# Patient Record
Sex: Female | Born: 1935
Health system: Southern US, Community
[De-identification: ages and names within clinical notes are randomized; demographics above are authoritative.]

## PROBLEM LIST (undated history)

## (undated) DIAGNOSIS — I10 Essential (primary) hypertension: Secondary | ICD-10-CM

## (undated) DIAGNOSIS — Z5189 Encounter for other specified aftercare: Secondary | ICD-10-CM

## (undated) HISTORY — PX: CATARACT EXTRACTION: SUR2

## (undated) HISTORY — DX: Essential (primary) hypertension: I10

## (undated) HISTORY — PX: GLAUCOMA SURGERY: SHX656

## (undated) HISTORY — DX: Encounter for other specified aftercare: Z51.89

---

## 1975-02-11 DIAGNOSIS — Z5189 Encounter for other specified aftercare: Secondary | ICD-10-CM

## 1975-02-11 DIAGNOSIS — IMO0001 Reserved for inherently not codable concepts without codable children: Secondary | ICD-10-CM

## 1975-02-11 HISTORY — DX: Encounter for other specified aftercare: Z51.89

## 1975-02-11 HISTORY — DX: Reserved for inherently not codable concepts without codable children: IMO0001

## 1975-02-11 HISTORY — PX: ABDOMINAL HYSTERECTOMY: SHX81

## 2011-03-07 ENCOUNTER — Encounter: Payer: Self-pay | Admitting: Internal Medicine

## 2011-03-07 ENCOUNTER — Ambulatory Visit (INDEPENDENT_AMBULATORY_CARE_PROVIDER_SITE_OTHER): Payer: Medicare Other | Admitting: Internal Medicine

## 2011-03-07 ENCOUNTER — Ambulatory Visit: Payer: Medicare Other

## 2011-03-07 VITALS — BP 120/82 | HR 63 | Temp 97.8°F | Ht 62.0 in | Wt 166.0 lb

## 2011-03-07 DIAGNOSIS — Z1211 Encounter for screening for malignant neoplasm of colon: Secondary | ICD-10-CM

## 2011-03-07 DIAGNOSIS — Z136 Encounter for screening for cardiovascular disorders: Secondary | ICD-10-CM

## 2011-03-07 DIAGNOSIS — R7309 Other abnormal glucose: Secondary | ICD-10-CM

## 2011-03-07 DIAGNOSIS — Z23 Encounter for immunization: Secondary | ICD-10-CM

## 2011-03-07 DIAGNOSIS — Z1231 Encounter for screening mammogram for malignant neoplasm of breast: Secondary | ICD-10-CM | POA: Diagnosis not present

## 2011-03-07 DIAGNOSIS — Z0000 Encounter for general adult medical examination without abnormal findings: Secondary | ICD-10-CM

## 2011-03-07 DIAGNOSIS — R079 Chest pain, unspecified: Secondary | ICD-10-CM

## 2011-03-07 DIAGNOSIS — R5383 Other fatigue: Secondary | ICD-10-CM

## 2011-03-07 DIAGNOSIS — R739 Hyperglycemia, unspecified: Secondary | ICD-10-CM

## 2011-03-07 DIAGNOSIS — R5381 Other malaise: Secondary | ICD-10-CM

## 2011-03-07 DIAGNOSIS — H409 Unspecified glaucoma: Secondary | ICD-10-CM | POA: Insufficient documentation

## 2011-03-07 LAB — HEPATIC FUNCTION PANEL
ALT: 13 U/L (ref 0–35)
Albumin: 4.2 g/dL (ref 3.5–5.2)
Total Bilirubin: 1 mg/dL (ref 0.3–1.2)

## 2011-03-07 LAB — BASIC METABOLIC PANEL
BUN: 16 mg/dL (ref 6–23)
Creatinine, Ser: 1 mg/dL (ref 0.4–1.2)
Glucose, Bld: 116 mg/dL — ABNORMAL HIGH (ref 70–99)
Potassium: 5 mEq/L (ref 3.5–5.1)
Sodium: 141 mEq/L (ref 135–145)

## 2011-03-07 LAB — LIPID PANEL
Cholesterol: 166 mg/dL (ref 0–200)
LDL Cholesterol: 91 mg/dL (ref 0–99)

## 2011-03-07 LAB — CBC WITH DIFFERENTIAL/PLATELET
Basophils Absolute: 0 10*3/uL (ref 0.0–0.1)
Eosinophils Absolute: 0.1 10*3/uL (ref 0.0–0.7)
MCHC: 33.9 g/dL (ref 30.0–36.0)
MCV: 91.8 fl (ref 78.0–100.0)
Monocytes Absolute: 0.4 10*3/uL (ref 0.1–1.0)
Neutrophils Relative %: 60.2 % (ref 43.0–77.0)
Platelets: 273 10*3/uL (ref 150.0–400.0)
RDW: 13.1 % (ref 11.5–14.6)

## 2011-03-07 LAB — HEMOGLOBIN A1C: Hgb A1c MFr Bld: 6.1 % (ref 4.6–6.5)

## 2011-03-07 NOTE — Patient Instructions (Signed)
It was good to see you today. Test(s) ordered today. Your results will be called to you after review (48-72hours after test completion). If any changes need to be made, you will be notified at that time. We have reviewed your prior records including labs and tests today Medications reviewed, no changes at this time Health Maintenance reviewed - mammogram ordered, patient to schedule appointment, tetanus booster given, colonoscopy refer done. we'll make referral to GI for colonoscopy and mammogram . Our office will contact you regarding appointment(s) once made.  Please schedule followup in 1 year, call sooner if problems.

## 2011-03-08 LAB — HEMOGLOBIN A1C
Hgb A1c MFr Bld: 6.9 % — ABNORMAL HIGH (ref <5.7)
Mean Plasma Glucose: 151 mg/dL — ABNORMAL HIGH (ref <117)

## 2011-03-09 ENCOUNTER — Encounter: Payer: Self-pay | Admitting: Internal Medicine

## 2011-03-09 DIAGNOSIS — R739 Hyperglycemia, unspecified: Secondary | ICD-10-CM | POA: Insufficient documentation

## 2011-03-09 NOTE — Progress Notes (Signed)
Subjective:    Patient ID: Melissa Warren, female    DOB: April 07, 1933, 78 y.o.   MRN: [id number]  HPI  New pt to me and our practice, here to establish care Has not been to PCP in >15y -  sees [facility]n URI and optho [facility]) q6-38mo for glaucoma Concern for DM given strong FH same and ?chol  transient L breast tissue pain x 3 days 6 weeks ago - was tender to touch, spont resolution, no recurrence since - No bruising, no breast mass - no recalled trauma or injury   Also here for medicare wellness  Diet: heart healthy Physical activity: sedentary Depression/mood screen: negative Hearing: intact to whispered voice Visual acuity: grossly normal, performs annual eye exam  ADLs: capable Fall risk: none Home safety: good Cognitive evaluation: intact to orientation, naming, recall and repetition EOL planning: adv directives, full code/ I agree  I have personally reviewed and have noted 1. The patient's medical and social history 2. Their use of alcohol, tobacco or illicit drugs 3. Their current medications and supplements 4. The patient's functional ability including ADL's, fall risks, home safety risks and hearing or visual impairment. 5. Diet and physical activities 6. Evidence for depression or mood disorders  Past Medical History  Diagnosis Date  . Glaucoma    Melissa Warren does not currently have medications on file.  Family History  Problem Relation Age of Onset  . Colon cancer Sister 10  . Prostate cancer Paternal Grandfather   . Stroke Maternal Grandmother   . Hypertension Maternal Grandmother   . Diabetes Mother   . Diabetes Maternal Grandmother   . Diabetes Brother   . Diabetes Brother   . Diabetes Brother   . Diabetes Sister   . Diabetes Sister   . Diabetes Sister    History   Social History  . Marital Status: Widowed    Spouse Name: N/A    Number of Children: N/A  . Years of Education: N/A   Occupational History  . Not on file.   Social History  Main Topics  . Smoking status: Former Games developer  . Smokeless tobacco: Not on file  . Alcohol Use: No  . Drug Use: No  . Sexually Active: Not on file   Other Topics Concern  . Not on file   Social History Narrative   [place] SW [place], lives with older sister and neice    Review of Systems Constitutional: Negative for fever or weight change; positive for fatigue.  Respiratory: Negative for cough and shortness of breath.   Cardiovascular: Negative for chest pain or palpitations.  Gastrointestinal: Negative for abdominal pain, no bowel changes.  Musculoskeletal: Negative for gait problem or joint swelling.  Skin: Negative for rash.  Neurological: Negative for dizziness or headache.  No other specific complaints in a complete review of systems (except as listed in HPI above).     Objective:   Physical Exam BP 120/82  Pulse 63  Temp(Src) 97.8 F (36.6 C) (Oral)  Ht 5\' 2"  (1.575 m)  Wt 166 lb (75.297 kg)  BMI 30.36 kg/m2  SpO2 97% Wt Readings from Last 3 Encounters:  03/07/11 166 lb (75.297 kg)   Constitutional: She appears well-developed and well-nourished. No distress.  HENT: Head: Normocephalic and atraumatic. Ears: B TMs ok, no erythema or effusion; Nose: Nose normal.  Mouth/Throat: Oropharynx is clear and moist. No oropharyngeal exudate.  Eyes: Conjunctivae and EOM are normal. Pupils are equal,  round, and reactive to light. No scleral icterus.  Neck: Normal range of motion. Neck supple. No JVD present. No thyromegaly present.  Cardiovascular: Normal rate, regular rhythm and normal heart sounds.  No murmur heard. No BLE edema. Pulmonary/Chest: Effort normal and breath sounds normal. No respiratory distress. She has no wheezes.  Abdominal: Soft. Bowel sounds are normal. She exhibits no distension. There is no tenderness. no masses Musculoskeletal: Normal range of motion, no joint effusions. No gross deformities Neurological: She is  alert and oriented to person, place, and time. No cranial nerve deficit. Coordination normal.  Skin: Skin is warm and dry. No rash noted. No erythema.  Psychiatric: She has a normal mood and affect. Her behavior is normal. Judgment and thought content normal.   Lab Results  Component Value Date   WBC 6.6 03/07/2011   HGB 14.1 03/07/2011   HCT 41.4 03/07/2011   PLT 273.0 03/07/2011   GLUCOSE 116* 03/07/2011   CHOL 166 03/07/2011   TRIG 79.0 03/07/2011   HDL 59.20 03/07/2011   LDLCALC 91 03/07/2011   ALT 13 03/07/2011   AST 21 03/07/2011   NA 141 03/07/2011   K 5.0 03/07/2011   CL 106 03/07/2011   CREATININE 1.0 03/07/2011   BUN 16 03/07/2011   CO2 28 03/07/2011   TSH 1.05 03/07/2011   HGBA1C 6.9* 03/07/2011   EKG today: NSR @ 60 bpm - no ischemic change or arrythmia     Assessment & Plan:   AWV - v70.0 - Today patient counseled on age appropriate routine health concerns for screening and prevention, each reviewed and up to date or declined. Immunizations reviewed and up to date or declined. Labs ordered/ECG reviewed. Risk factors for depression reviewed and negative. Hearing function and visual acuity are intact. ADLs screened and addressed as needed. Functional ability and level of safety reviewed and appropriate. Education, counseling and referrals performed based on assessed risks today. Patient provided with a copy of personalized plan for preventive services.  Fatigue - exam nonspecific - check labs as ordered, reassurance provided  Transient L breast pain, resolved - suspect MSkel but refer for mammo - EKG today ok - labs as ordered - pt to call if recurrence for other eval tx as needed, reassurance provided

## 2011-03-19 DIAGNOSIS — Z83511 Family history of glaucoma: Secondary | ICD-10-CM | POA: Diagnosis not present

## 2011-03-19 DIAGNOSIS — H409 Unspecified glaucoma: Secondary | ICD-10-CM | POA: Diagnosis not present

## 2011-03-19 DIAGNOSIS — H4011X0 Primary open-angle glaucoma, stage unspecified: Secondary | ICD-10-CM | POA: Diagnosis not present

## 2011-03-20 ENCOUNTER — Encounter: Payer: Self-pay | Admitting: Gastroenterology

## 2011-03-21 ENCOUNTER — Ambulatory Visit
Admission: RE | Admit: 2011-03-21 | Discharge: 2011-03-21 | Disposition: A | Discharge status: Home / Self Care | Payer: Medicare Other | Source: Ambulatory Visit | Attending: Internal Medicine | Admitting: Internal Medicine

## 2011-03-21 DIAGNOSIS — Z1231 Encounter for screening mammogram for malignant neoplasm of breast: Secondary | ICD-10-CM | POA: Diagnosis not present

## 2011-03-24 ENCOUNTER — Ambulatory Visit (AMBULATORY_SURGERY_CENTER): Payer: Medicare Other | Admitting: *Deleted

## 2011-03-24 VITALS — Ht 62.0 in | Wt 166.0 lb

## 2011-03-24 DIAGNOSIS — Z1211 Encounter for screening for malignant neoplasm of colon: Secondary | ICD-10-CM

## 2011-03-24 MED ORDER — PEG-KCL-NACL-NASULF-NA ASC-C 100 G PO SOLR: ORAL | Status: DC

## 2011-03-28 ENCOUNTER — Other Ambulatory Visit: Payer: Self-pay | Admitting: Internal Medicine

## 2011-03-28 DIAGNOSIS — R928 Other abnormal and inconclusive findings on diagnostic imaging of breast: Secondary | ICD-10-CM

## 2011-04-07 ENCOUNTER — Ambulatory Visit (AMBULATORY_SURGERY_CENTER): Payer: Medicare Other | Admitting: Gastroenterology

## 2011-04-07 ENCOUNTER — Encounter: Payer: Self-pay | Admitting: Gastroenterology

## 2011-04-07 VITALS — BP 182/99 | HR 64 | Temp 98.2°F | Resp 21 | Ht 62.0 in | Wt 166.0 lb

## 2011-04-07 DIAGNOSIS — Z1211 Encounter for screening for malignant neoplasm of colon: Secondary | ICD-10-CM | POA: Diagnosis not present

## 2011-04-07 DIAGNOSIS — R7309 Other abnormal glucose: Secondary | ICD-10-CM | POA: Diagnosis not present

## 2011-04-07 DIAGNOSIS — H409 Unspecified glaucoma: Secondary | ICD-10-CM | POA: Diagnosis not present

## 2011-04-07 DIAGNOSIS — Z800 Family history of malignant neoplasm of digestive organs: Secondary | ICD-10-CM | POA: Diagnosis not present

## 2011-04-07 MED ORDER — SODIUM CHLORIDE 0.9 % IV SOLN: 500.0000 mL | INTRAVENOUS | Status: DC

## 2011-04-07 NOTE — Progress Notes (Signed)
Patient did not experience any of the following events: a burn prior to discharge; a fall within the facility; wrong site/side/patient/procedure/implant event; or a hospital transfer or hospital admission upon discharge from the facility. (G8907) Patient did not have preoperative order for IV antibiotic SSI prophylaxis. (G8918)  

## 2011-04-07 NOTE — Op Note (Signed)
 [facility] [address] [place], [place]  [address]  COLONOSCOPY PROCEDURE REPORT  PATIENT:  Melissa, Warren  MR#:  [id number] BIRTHDATE:  [birth year], 78 yrs. old  GENDER:  female ENDOSCOPIST:  Vania Rea. Jarold Motto, MD, [facility] REF. BY:  Rene Paci, M.D. PROCEDURE DATE:  04/07/2011 PROCEDURE:  Higher-risk screening colonoscopy G0105  ASA CLASS:  Class II INDICATIONS:  family history of colon cancer sister MEDICATIONS:   propofol (Diprivan) 100 mg IV  DESCRIPTION OF PROCEDURE:   After the risks and benefits and of the procedure were explained, informed consent was obtained. Digital rectal exam was performed and revealed no abnormalities. The LB [id number] endoscope was introduced through the anus and advanced to the cecum, which was identified by both the appendix and ileocecal valve.  The quality of the prep was excellent, using MoviPrep.  The instrument was then slowly withdrawn as the colon was fully examined. <<PROCEDUREIMAGES>>  FINDINGS:  Moderate diverticulosis was found in the sigmoid to descending colon segments. SCATTERED RIGHT COLON TICS ALSO NOTED. No polyps or cancers were seen.  This was otherwise a normal examination of the colon.   Retroflexed views in the rectum revealed no abnormalities.    The scope was then withdrawn from the patient and the procedure completed.  COMPLICATIONS:  None ENDOSCOPIC IMPRESSION: 1) Moderate diverticulosis in the sigmoid to descending colon segments 2) No polyps or cancers 3) Otherwise normal examination RECOMMENDATIONS: 1) High fiber diet. F/U COLONOSCOPY PRN AS NEEDED./.  REPEAT EXAM:  No  ______________________________ Vania Rea. Jarold Motto, MD, [name]  CC:  n. eSIGNED:   Vania Rea. Patterson at 04/07/2011 01:56 PM  Agustin Cree, [phone]

## 2011-04-07 NOTE — Patient Instructions (Signed)
YOU HAD AN ENDOSCOPIC PROCEDURE TODAY AT THE [facility]: Refer to the procedure report that was given to you for any specific questions about what was found during the examination.  If the procedure report does not answer your questions, please call your gastroenterologist to clarify.  If you requested that your care partner not be given the details of your procedure findings, then the procedure report has been included in a sealed envelope for you to review at your convenience later.  YOU SHOULD EXPECT: Some feelings of bloating in the abdomen. Passage of more gas than usual.  Walking can help get rid of the air that was put into your GI tract during the procedure and reduce the bloating. If you had a lower endoscopy (such as a colonoscopy or flexible sigmoidoscopy) you may notice spotting of blood in your stool or on the toilet paper. If you underwent a bowel prep for your procedure, then you may not have a normal bowel movement for a few days.  DIET: Your first meal following the procedure should be a light meal and then it is ok to progress to your normal diet.  A half-sandwich or bowl of soup is an example of a good first meal.  Heavy or fried foods are harder to digest and may make you feel nauseous or bloated.  Likewise meals heavy in dairy and vegetables can cause extra gas to form and this can also increase the bloating.  Drink plenty of fluids but you should avoid alcoholic beverages for 24 hours.  ACTIVITY: Your care partner should take you home directly after the procedure.  You should plan to take it easy, moving slowly for the rest of the day.  You can resume normal activity the day after the procedure however you should NOT DRIVE or use heavy machinery for 24 hours (because of the sedation medicines used during the test).    SYMPTOMS TO REPORT IMMEDIATELY: A gastroenterologist can be reached at any hour.  During normal business hours, 8:30 AM to 5:00 PM Monday through Friday,  call [phone].  After hours and on weekends, please call the GI answering service at [phone] who will take a message and have the physician on call contact you.   Following lower endoscopy (colonoscopy or flexible sigmoidoscopy):  Excessive amounts of blood in the stool  Significant tenderness or worsening of abdominal pains  Swelling of the abdomen that is new, acute  Fever of 100F or higher  Following upper endoscopy (EGD)  Vomiting of blood or coffee ground material  New chest pain or pain under the shoulder blades  Painful or persistently difficult swallowing  New shortness of breath  Fever of 100F or higher  Black, tarry-looking stools  FOLLOW UP: If any biopsies were taken you will be contacted by phone or by letter within the next 1-3 weeks.  Call your gastroenterologist if you have not heard about the biopsies in 3 weeks.  Our staff will call the home number listed on your records the next business day following your procedure to check on you and address any questions or concerns that you may have at that time regarding the information given to you following your procedure. This is a courtesy call and so if there is no answer at the home number and we have not heard from you through the emergency physician on call, we will assume that you have returned to your regular daily activities without incident.  SIGNATURES/CONFIDENTIALITY: You and/or your care   partner have signed paperwork which will be entered into your electronic medical record.  These signatures attest to the fact that that the information above on your After Visit Summary has been reviewed and is understood.  Full responsibility of the confidentiality of this discharge information lies with you and/or your care-partner.  

## 2011-04-08 ENCOUNTER — Telehealth: Payer: Self-pay

## 2011-04-08 NOTE — Telephone Encounter (Signed)
  Follow up Call-  Call back number 04/07/2011  Post procedure Call Back phone  # [phone]  Permission to leave phone message Yes     Patient questions:  Do you have a fever, pain , or abdominal swelling? no Pain Score  0 *  Have you tolerated food without any problems? yes  Have you been able to return to your normal activities? yes  Do you have any questions about your discharge instructions: Diet   no Medications  no Follow up visit  no  Do you have questions or concerns about your Care? no  Actions: * If pain score is 4 or above: No action needed, pain <4.  Per the pt "everything looks good". Maw

## 2011-04-10 ENCOUNTER — Ambulatory Visit
Admission: RE | Admit: 2011-04-10 | Discharge: 2011-04-10 | Disposition: A | Discharge status: Home / Self Care | Payer: Medicare Other | Source: Ambulatory Visit | Attending: Internal Medicine | Admitting: Internal Medicine

## 2011-04-10 ENCOUNTER — Other Ambulatory Visit: Payer: Self-pay | Admitting: Internal Medicine

## 2011-04-10 DIAGNOSIS — R928 Other abnormal and inconclusive findings on diagnostic imaging of breast: Secondary | ICD-10-CM

## 2011-04-10 DIAGNOSIS — N63 Unspecified lump in unspecified breast: Secondary | ICD-10-CM | POA: Diagnosis not present

## 2011-04-10 DIAGNOSIS — N6019 Diffuse cystic mastopathy of unspecified breast: Secondary | ICD-10-CM | POA: Diagnosis not present

## 2011-04-16 ENCOUNTER — Other Ambulatory Visit: Payer: Self-pay | Admitting: Internal Medicine

## 2011-04-16 ENCOUNTER — Ambulatory Visit
Admission: RE | Admit: 2011-04-16 | Discharge: 2011-04-16 | Disposition: A | Discharge status: Home / Self Care | Payer: Medicare Other | Source: Ambulatory Visit | Attending: Internal Medicine | Admitting: Internal Medicine

## 2011-04-16 DIAGNOSIS — N6089 Other benign mammary dysplasias of unspecified breast: Secondary | ICD-10-CM | POA: Diagnosis not present

## 2011-04-16 DIAGNOSIS — R928 Other abnormal and inconclusive findings on diagnostic imaging of breast: Secondary | ICD-10-CM

## 2011-04-16 DIAGNOSIS — N6489 Other specified disorders of breast: Secondary | ICD-10-CM | POA: Diagnosis not present

## 2011-04-16 DIAGNOSIS — N63 Unspecified lump in unspecified breast: Secondary | ICD-10-CM | POA: Diagnosis not present

## 2011-04-25 DIAGNOSIS — Z83511 Family history of glaucoma: Secondary | ICD-10-CM | POA: Diagnosis not present

## 2011-04-25 DIAGNOSIS — H409 Unspecified glaucoma: Secondary | ICD-10-CM | POA: Diagnosis not present

## 2011-04-25 DIAGNOSIS — H4011X0 Primary open-angle glaucoma, stage unspecified: Secondary | ICD-10-CM | POA: Diagnosis not present

## 2011-06-10 DIAGNOSIS — Z83511 Family history of glaucoma: Secondary | ICD-10-CM | POA: Diagnosis not present

## 2011-06-10 DIAGNOSIS — H409 Unspecified glaucoma: Secondary | ICD-10-CM | POA: Diagnosis not present

## 2011-06-10 DIAGNOSIS — H4011X0 Primary open-angle glaucoma, stage unspecified: Secondary | ICD-10-CM | POA: Diagnosis not present

## 2011-06-16 DIAGNOSIS — H2510 Age-related nuclear cataract, unspecified eye: Secondary | ICD-10-CM | POA: Diagnosis not present

## 2011-06-16 DIAGNOSIS — H409 Unspecified glaucoma: Secondary | ICD-10-CM | POA: Diagnosis not present

## 2011-06-16 DIAGNOSIS — Z83511 Family history of glaucoma: Secondary | ICD-10-CM | POA: Diagnosis not present

## 2011-06-16 DIAGNOSIS — H4011X0 Primary open-angle glaucoma, stage unspecified: Secondary | ICD-10-CM | POA: Diagnosis not present

## 2011-06-17 DIAGNOSIS — H409 Unspecified glaucoma: Secondary | ICD-10-CM | POA: Diagnosis not present

## 2011-06-17 DIAGNOSIS — H4011X0 Primary open-angle glaucoma, stage unspecified: Secondary | ICD-10-CM | POA: Diagnosis not present

## 2011-06-17 DIAGNOSIS — H4010X0 Unspecified open-angle glaucoma, stage unspecified: Secondary | ICD-10-CM | POA: Diagnosis not present

## 2011-06-25 DIAGNOSIS — Z9889 Other specified postprocedural states: Secondary | ICD-10-CM | POA: Diagnosis not present

## 2011-07-02 DIAGNOSIS — H4011X0 Primary open-angle glaucoma, stage unspecified: Secondary | ICD-10-CM | POA: Diagnosis not present

## 2011-07-02 DIAGNOSIS — Z9889 Other specified postprocedural states: Secondary | ICD-10-CM | POA: Diagnosis not present

## 2011-07-08 DIAGNOSIS — H04129 Dry eye syndrome of unspecified lacrimal gland: Secondary | ICD-10-CM | POA: Diagnosis not present

## 2011-07-14 DIAGNOSIS — Z9889 Other specified postprocedural states: Secondary | ICD-10-CM | POA: Diagnosis not present

## 2011-08-13 DIAGNOSIS — H409 Unspecified glaucoma: Secondary | ICD-10-CM | POA: Diagnosis not present

## 2011-08-13 DIAGNOSIS — H4011X0 Primary open-angle glaucoma, stage unspecified: Secondary | ICD-10-CM | POA: Diagnosis not present

## 2011-09-24 DIAGNOSIS — H409 Unspecified glaucoma: Secondary | ICD-10-CM | POA: Diagnosis not present

## 2011-09-24 DIAGNOSIS — H2510 Age-related nuclear cataract, unspecified eye: Secondary | ICD-10-CM | POA: Diagnosis not present

## 2011-09-24 DIAGNOSIS — H4011X0 Primary open-angle glaucoma, stage unspecified: Secondary | ICD-10-CM | POA: Diagnosis not present

## 2011-10-24 DIAGNOSIS — H4011X0 Primary open-angle glaucoma, stage unspecified: Secondary | ICD-10-CM | POA: Diagnosis not present

## 2011-12-03 DIAGNOSIS — H4011X0 Primary open-angle glaucoma, stage unspecified: Secondary | ICD-10-CM | POA: Diagnosis not present

## 2011-12-03 DIAGNOSIS — H2510 Age-related nuclear cataract, unspecified eye: Secondary | ICD-10-CM | POA: Diagnosis not present

## 2011-12-03 DIAGNOSIS — H409 Unspecified glaucoma: Secondary | ICD-10-CM | POA: Diagnosis not present

## 2011-12-10 DIAGNOSIS — H4011X0 Primary open-angle glaucoma, stage unspecified: Secondary | ICD-10-CM | POA: Diagnosis not present

## 2011-12-10 DIAGNOSIS — H409 Unspecified glaucoma: Secondary | ICD-10-CM | POA: Diagnosis not present

## 2011-12-12 HISTORY — PX: GLAUCOMA VALVE INSERTION: SHX5297

## 2011-12-18 DIAGNOSIS — H4010X0 Unspecified open-angle glaucoma, stage unspecified: Secondary | ICD-10-CM | POA: Diagnosis not present

## 2011-12-18 DIAGNOSIS — H4011X0 Primary open-angle glaucoma, stage unspecified: Secondary | ICD-10-CM | POA: Diagnosis not present

## 2011-12-18 DIAGNOSIS — H409 Unspecified glaucoma: Secondary | ICD-10-CM | POA: Diagnosis not present

## 2012-02-06 DIAGNOSIS — Z9889 Other specified postprocedural states: Secondary | ICD-10-CM | POA: Diagnosis not present

## 2012-03-04 ENCOUNTER — Other Ambulatory Visit: Payer: Self-pay | Admitting: Internal Medicine

## 2012-03-04 DIAGNOSIS — N63 Unspecified lump in unspecified breast: Secondary | ICD-10-CM

## 2012-03-15 ENCOUNTER — Ambulatory Visit
Admission: RE | Admit: 2012-03-15 | Discharge: 2012-03-15 | Disposition: A | Discharge status: Home / Self Care | Payer: Medicare Other | Source: Ambulatory Visit | Attending: Internal Medicine | Admitting: Internal Medicine

## 2012-03-15 DIAGNOSIS — N63 Unspecified lump in unspecified breast: Secondary | ICD-10-CM

## 2012-03-15 DIAGNOSIS — R922 Inconclusive mammogram: Secondary | ICD-10-CM | POA: Diagnosis not present

## 2012-03-22 ENCOUNTER — Encounter: Payer: Self-pay | Admitting: Internal Medicine

## 2012-03-22 ENCOUNTER — Ambulatory Visit (INDEPENDENT_AMBULATORY_CARE_PROVIDER_SITE_OTHER): Payer: Medicare Other | Admitting: Internal Medicine

## 2012-03-22 ENCOUNTER — Other Ambulatory Visit (INDEPENDENT_AMBULATORY_CARE_PROVIDER_SITE_OTHER): Payer: Medicare Other

## 2012-03-22 VITALS — BP 142/90 | HR 70 | Temp 97.7°F | Ht 62.0 in | Wt 158.1 lb

## 2012-03-22 DIAGNOSIS — R7309 Other abnormal glucose: Secondary | ICD-10-CM

## 2012-03-22 DIAGNOSIS — Z23 Encounter for immunization: Secondary | ICD-10-CM

## 2012-03-22 DIAGNOSIS — Z136 Encounter for screening for cardiovascular disorders: Secondary | ICD-10-CM

## 2012-03-22 DIAGNOSIS — R5381 Other malaise: Secondary | ICD-10-CM | POA: Diagnosis not present

## 2012-03-22 DIAGNOSIS — R5383 Other fatigue: Secondary | ICD-10-CM

## 2012-03-22 DIAGNOSIS — H409 Unspecified glaucoma: Secondary | ICD-10-CM | POA: Diagnosis not present

## 2012-03-22 DIAGNOSIS — Z0000 Encounter for general adult medical examination without abnormal findings: Secondary | ICD-10-CM

## 2012-03-22 DIAGNOSIS — R739 Hyperglycemia, unspecified: Secondary | ICD-10-CM

## 2012-03-22 LAB — CBC WITH DIFFERENTIAL/PLATELET
Basophils Absolute: 0 10*3/uL (ref 0.0–0.1)
Lymphocytes Relative: 32.6 % (ref 12.0–46.0)
Monocytes Relative: 7.3 % (ref 3.0–12.0)
Neutrophils Relative %: 58 % (ref 43.0–77.0)
Platelets: 280 10*3/uL (ref 150.0–400.0)
RDW: 12.8 % (ref 11.5–14.6)

## 2012-03-22 LAB — HEPATIC FUNCTION PANEL
ALT: 11 U/L (ref 0–35)
AST: 19 U/L (ref 0–37)
Bilirubin, Direct: 0.1 mg/dL (ref 0.0–0.3)
Total Bilirubin: 0.7 mg/dL (ref 0.3–1.2)

## 2012-03-22 LAB — BASIC METABOLIC PANEL
BUN: 15 mg/dL (ref 6–23)
Calcium: 9.4 mg/dL (ref 8.4–10.5)
GFR: 75.19 mL/min (ref >60.00)
Glucose, Bld: 95 mg/dL (ref 70–99)
Sodium: 138 mEq/L (ref 135–145)

## 2012-03-22 LAB — TSH: TSH: 0.67 u[IU]/mL (ref 0.35–5.50)

## 2012-03-22 LAB — HEMOGLOBIN A1C: Hgb A1c MFr Bld: 5.8 % (ref 4.6–6.5)

## 2012-03-22 LAB — LIPID PANEL: HDL: 49.9 mg/dL (ref >39.00)

## 2012-03-22 NOTE — Assessment & Plan Note (Signed)
Interval hx reviewed - continue drops and follow up as planned

## 2012-03-22 NOTE — Progress Notes (Signed)
Subjective:    Patient ID: Melissa Warren, female    DOB: 1933-03-05, 79 y.o.   MRN: [id number]  HPI  here for medicare wellness  Diet: heart healthy Physical activity: sedentary Depression/mood screen: negative Hearing: intact to whispered voice Visual acuity: grossly normal, performs eye exam with optho [facility]) q6-13mo for glaucoma ADLs: capable Fall risk: none Home safety: good Cognitive evaluation: intact to orientation, naming, recall and repetition EOL planning: adv directives, full code/ I agree  I have personally reviewed and have noted 1. The patient's medical and social history 2. Their use of alcohol, tobacco or illicit drugs 3. Their current medications and supplements 4. The patient's functional ability including ADL's, fall risks, home safety risks and hearing or visual impairment. 5. Diet and physical activities 6. Evidence for depression or mood disorders  Past Medical History  Diagnosis Date  . Glaucoma   . Blood transfusion 1977    after hysterectomy  . Cataract   . Constipation     Family History  Problem Relation Age of Onset  . Colon cancer Sister 56  . Prostate cancer Paternal Grandfather   . Stroke Maternal Grandmother   . Hypertension Maternal Grandmother   . Diabetes Maternal Grandmother   . Diabetes Mother   . Diabetes Brother   . Diabetes Brother   . Diabetes Brother   . Diabetes Sister   . Diabetes Sister   . Diabetes Sister    History  Substance Use Topics  . Smoking status: Former Games developer  . Smokeless tobacco: Never Used  . Alcohol Use: No   Review of Systems  Constitutional: Negative for fever or weight change; positive for fatigue.  Respiratory: Negative for cough and shortness of breath.   Cardiovascular: Negative for chest pain or palpitations.  Gastrointestinal: Negative for abdominal pain, no bowel changes.  Musculoskeletal: Negative for gait problem or joint swelling.  Skin: Negative for rash.  Neurological:  Negative for dizziness or headache.  No other specific complaints in a complete review of systems (except as listed in HPI above).     Objective:   Physical Exam  BP 142/90  Pulse 70  Temp(Src) 97.7 F (36.5 C) (Oral)  Ht 5\' 2"  (1.575 m)  Wt 158 lb 1.9 oz (71.723 kg)  BMI 28.91 kg/m2  SpO2 97% Wt Readings from Last 3 Encounters:  03/22/12 158 lb 1.9 oz (71.723 kg)  04/07/11 166 lb (75.297 kg)  03/24/11 166 lb (75.297 kg)   Constitutional: She appears well-developed and well-nourished. No distress.  HENT: Head: Normocephalic and atraumatic. Ears: B TMs ok, no erythema or effusion; Nose: Nose normal.  Mouth/Throat: Oropharynx is clear and moist. No oropharyngeal exudate.  Eyes: R eye with min edema/swelling compared to L - Conjunctivae and EOM are normal. Pupils are equal, round, and reactive to light. No scleral icterus.  Neck: Normal range of motion. Neck supple. No JVD present. No thyromegaly present.  Cardiovascular: Normal rate, regular rhythm and normal heart sounds.  No murmur heard. No BLE edema. Pulmonary/Chest: Effort normal and breath sounds normal. No respiratory distress. She has no wheezes.  Abdominal: Soft. Bowel sounds are normal. She exhibits no distension. There is no tenderness. no masses Musculoskeletal: Normal range of motion, no joint effusions. No gross deformities Neurological: She is alert and oriented to person, place, and time. No cranial nerve deficit. Coordination normal.  Skin: Skin is warm and dry. No rash noted. No erythema.  Psychiatric: She has a normal mood and affect. Her behavior  is normal. Judgment and thought content normal.   Lab Results  Component Value Date   WBC 6.6 03/07/2011   HGB 14.1 03/07/2011   HCT 41.4 03/07/2011   PLT 273.0 03/07/2011   GLUCOSE 116* 03/07/2011   CHOL 166 03/07/2011   TRIG 79.0 03/07/2011   HDL 59.20 03/07/2011   LDLCALC 91 03/07/2011   ALT 13 03/07/2011   AST 21 03/07/2011   NA 141 03/07/2011   K 5.0 03/07/2011   CL  106 03/07/2011   CREATININE 1.0 03/07/2011   BUN 16 03/07/2011   CO2 28 03/07/2011   TSH 1.05 03/07/2011   HGBA1C 6.9* 03/07/2011   EKG today: NSR @ 63 bpm - no ischemic change or arrythmia - unchanged from 02/2011     Assessment & Plan:   AWV - v70.0 - Today patient counseled on age appropriate routine health concerns for screening and prevention, each reviewed and up to date or declined. Immunizations reviewed and up to date or declined. Labs ordered/ECG reviewed. Risk factors for depression reviewed and negative. Hearing function and visual acuity are intact. ADLs screened and addressed as needed. Functional ability and level of safety reviewed and appropriate. Education, counseling and referrals performed based on assessed risks today. Patient provided with a copy of personalized plan for preventive services.  Fatigue - nonspecific symptoms/exam - check screening labs

## 2012-03-22 NOTE — Assessment & Plan Note (Signed)
Strong FH same Check a1c Lab Results  Component Value Date   HGBA1C 6.9* 03/07/2011

## 2012-03-22 NOTE — Patient Instructions (Addendum)
It was good to see you today. Test(s) ordered today. Your results will be called to you after review (48-72hours after test completion). If any changes need to be made, you will be notified at that time. Medications reviewed and updated, no changes at this time Health Maintenance reviewed - pneumonia vaccine updated, Shingles vaccine - all other recommended immunizations and age-appropriate screenings are up-to-date.  Please schedule followup in 1 year, call sooner if problems.  Health Maintenance, Females A healthy lifestyle and preventative care can promote health and wellness.  Maintain regular health, dental, and eye exams.  Eat a healthy diet. Foods like vegetables, fruits, whole grains, low-fat dairy products, and lean protein foods contain the nutrients you need without too many calories. Decrease your intake of foods high in solid fats, added sugars, and salt. Get information about a proper diet from your caregiver, if necessary.  Regular physical exercise is one of the most important things you can do for your health. Most adults should get at least 150 minutes of moderate-intensity exercise (any activity that increases your heart rate and causes you to sweat) each week. In addition, most adults need muscle-strengthening exercises on 2 or more days a week.   Maintain a healthy weight. The body mass index (BMI) is a screening tool to identify possible weight problems. It provides an estimate of body fat based on height and weight. Your caregiver can help determine your BMI, and can help you achieve or maintain a healthy weight. For adults 20 years and older:  A BMI below 18.5 is considered underweight.  A BMI of 18.5 to 24.9 is normal.  A BMI of 25 to 29.9 is considered overweight.  A BMI of 30 and above is considered obese.  Maintain normal blood lipids and cholesterol by exercising and minimizing your intake of saturated fat. Eat a balanced diet with plenty of fruits and  vegetables. Blood tests for lipids and cholesterol should begin at age 69 and be repeated every 5 years. If your lipid or cholesterol levels are high, you are over 50, or you are a high risk for heart disease, you may need your cholesterol levels checked more frequently.Ongoing high lipid and cholesterol levels should be treated with medicines if diet and exercise are not effective.  If you smoke, find out from your caregiver how to quit. If you do not use tobacco, do not start.  If you are pregnant, do not drink alcohol. If you are breastfeeding, be very cautious about drinking alcohol. If you are not pregnant and choose to drink alcohol, do not exceed 1 drink per day. One drink is considered to be 12 ounces (355 mL) of beer, 5 ounces (148 mL) of wine, or 1.5 ounces (44 mL) of liquor.  Avoid use of street drugs. Do not share needles with anyone. Ask for help if you need support or instructions about stopping the use of drugs.  High blood pressure causes heart disease and increases the risk of stroke. Blood pressure should be checked at least every 1 to 2 years. Ongoing high blood pressure should be treated with medicines, if weight loss and exercise are not effective.  If you are 45 to 77 years old, ask your caregiver if you should take aspirin to prevent strokes.  Diabetes screening involves taking a blood sample to check your fasting blood sugar level. This should be done once every 3 years, after age 46, if you are within normal weight and without risk factors for diabetes. Testing should  be considered at a younger age or be carried out more frequently if you are overweight and have at least 1 risk factor for diabetes.  Breast cancer screening is essential preventative care for women. You should practice "breast self-awareness." This means understanding the normal appearance and feel of your breasts and may include breast self-examination. Any changes detected, no matter how small, should be  reported to a caregiver. Women in their 57s and 30s should have a clinical breast exam (CBE) by a caregiver as part of a regular health exam every 1 to 3 years. After age 33, women should have a CBE every year. Starting at age 68, women should consider having a mammogram (breast X-ray) every year. Women who have a family history of breast cancer should talk to their caregiver about genetic screening. Women at a high risk of breast cancer should talk to their caregiver about having an MRI and a mammogram every year.  The Pap test is a screening test for cervical cancer. Women should have a Pap test starting at age 46. Between ages 38 and 46, Pap tests should be repeated every 2 years. Beginning at age 18, you should have a Pap test every 3 years as long as the past 3 Pap tests have been normal. If you had a hysterectomy for a problem that was not cancer or a condition that could lead to cancer, then you no longer need Pap tests. If you are between ages 28 and 54, and you have had normal Pap tests going back 10 years, you no longer need Pap tests. If you have had past treatment for cervical cancer or a condition that could lead to cancer, you need Pap tests and screening for cancer for at least 20 years after your treatment. If Pap tests have been discontinued, risk factors (such as a new sexual partner) need to be reassessed to determine if screening should be resumed. Some women have medical problems that increase the chance of getting cervical cancer. In these cases, your caregiver may recommend more frequent screening and Pap tests.  The human papillomavirus (HPV) test is an additional test that may be used for cervical cancer screening. The HPV test looks for the virus that can cause the cell changes on the cervix. The cells collected during the Pap test can be tested for HPV. The HPV test could be used to screen women aged 54 years and older, and should be used in women of any age who have unclear Pap test  results. After the age of 35, women should have HPV testing at the same frequency as a Pap test.  Colorectal cancer can be detected and often prevented. Most routine colorectal cancer screening begins at the age of 15 and continues through age 41. However, your caregiver may recommend screening at an earlier age if you have risk factors for colon cancer. On a yearly basis, your caregiver may provide home test kits to check for hidden blood in the stool. Use of a small camera at the end of a tube, to directly examine the colon (sigmoidoscopy or colonoscopy), can detect the earliest forms of colorectal cancer. Talk to your caregiver about this at age 72, when routine screening begins. Direct examination of the colon should be repeated every 5 to 10 years through age 3, unless early forms of pre-cancerous polyps or small growths are found.  Hepatitis C blood testing is recommended for all people born from [birth year] through 1963 and any individual with known risks for  hepatitis C.  Practice safe sex. Use condoms and avoid high-risk sexual practices to reduce the spread of sexually transmitted infections (STIs). Sexually active women aged 21 and younger should be checked for Chlamydia, which is a common sexually transmitted infection. Older women with new or multiple partners should also be tested for Chlamydia. Testing for other STIs is recommended if you are sexually active and at increased risk.  Osteoporosis is a disease in which the bones lose minerals and strength with aging. This can result in serious bone fractures. The risk of osteoporosis can be identified using a bone density scan. Women ages 60 and over and women at risk for fractures or osteoporosis should discuss screening with their caregivers. Ask your caregiver whether you should be taking a calcium supplement or vitamin D to reduce the rate of osteoporosis.  Menopause can be associated with physical symptoms and risks. Hormone replacement therapy  is available to decrease symptoms and risks. You should talk to your caregiver about whether hormone replacement therapy is right for you.  Use sunscreen with a sun protection factor (SPF) of 30 or greater. Apply sunscreen liberally and repeatedly throughout the day. You should seek shade when your shadow is shorter than you. Protect yourself by wearing long sleeves, pants, a wide-brimmed hat, and sunglasses year round, whenever you are outdoors.  Notify your caregiver of new moles or changes in moles, especially if there is a change in shape or color. Also notify your caregiver if a mole is larger than the size of a pencil eraser.  Stay current with your immunizations. Document Released: 08/12/2010 Document Revised: 04/21/2011 Document Reviewed: 08/12/2010 [facility] Patient Information 2013 [place], [place].

## 2012-03-23 DIAGNOSIS — Z9889 Other specified postprocedural states: Secondary | ICD-10-CM | POA: Diagnosis not present

## 2012-03-29 DIAGNOSIS — H4440 Unspecified hypotony of eye: Secondary | ICD-10-CM | POA: Diagnosis not present

## 2012-05-13 DIAGNOSIS — Z9889 Other specified postprocedural states: Secondary | ICD-10-CM | POA: Diagnosis not present

## 2012-05-20 DIAGNOSIS — H10019 Acute follicular conjunctivitis, unspecified eye: Secondary | ICD-10-CM | POA: Diagnosis not present

## 2012-05-20 DIAGNOSIS — H2510 Age-related nuclear cataract, unspecified eye: Secondary | ICD-10-CM | POA: Diagnosis not present

## 2012-05-20 DIAGNOSIS — H4011X0 Primary open-angle glaucoma, stage unspecified: Secondary | ICD-10-CM | POA: Diagnosis not present

## 2012-05-20 DIAGNOSIS — H409 Unspecified glaucoma: Secondary | ICD-10-CM | POA: Diagnosis not present

## 2012-07-06 DIAGNOSIS — H2510 Age-related nuclear cataract, unspecified eye: Secondary | ICD-10-CM | POA: Diagnosis not present

## 2012-07-06 DIAGNOSIS — H21549 Posterior synechiae (iris), unspecified eye: Secondary | ICD-10-CM | POA: Diagnosis not present

## 2012-07-06 DIAGNOSIS — H4011X0 Primary open-angle glaucoma, stage unspecified: Secondary | ICD-10-CM | POA: Diagnosis not present

## 2012-07-06 DIAGNOSIS — R030 Elevated blood-pressure reading, without diagnosis of hypertension: Secondary | ICD-10-CM | POA: Diagnosis not present

## 2012-07-06 DIAGNOSIS — H409 Unspecified glaucoma: Secondary | ICD-10-CM | POA: Diagnosis not present

## 2012-07-06 DIAGNOSIS — J309 Allergic rhinitis, unspecified: Secondary | ICD-10-CM | POA: Diagnosis not present

## 2012-07-06 DIAGNOSIS — Z90710 Acquired absence of both cervix and uterus: Secondary | ICD-10-CM | POA: Diagnosis not present

## 2012-07-06 DIAGNOSIS — H10019 Acute follicular conjunctivitis, unspecified eye: Secondary | ICD-10-CM | POA: Diagnosis not present

## 2012-07-06 DIAGNOSIS — Z79899 Other long term (current) drug therapy: Secondary | ICD-10-CM | POA: Diagnosis not present

## 2012-07-06 DIAGNOSIS — M129 Arthropathy, unspecified: Secondary | ICD-10-CM | POA: Diagnosis not present

## 2012-07-06 DIAGNOSIS — Z791 Long term (current) use of non-steroidal anti-inflammatories (NSAID): Secondary | ICD-10-CM | POA: Diagnosis not present

## 2012-07-06 DIAGNOSIS — H25049 Posterior subcapsular polar age-related cataract, unspecified eye: Secondary | ICD-10-CM | POA: Diagnosis not present

## 2012-07-27 DIAGNOSIS — H4010X0 Unspecified open-angle glaucoma, stage unspecified: Secondary | ICD-10-CM | POA: Diagnosis not present

## 2012-07-27 DIAGNOSIS — M129 Arthropathy, unspecified: Secondary | ICD-10-CM | POA: Diagnosis not present

## 2012-07-27 DIAGNOSIS — H4011X0 Primary open-angle glaucoma, stage unspecified: Secondary | ICD-10-CM | POA: Diagnosis not present

## 2012-07-27 DIAGNOSIS — Z90710 Acquired absence of both cervix and uterus: Secondary | ICD-10-CM | POA: Diagnosis not present

## 2012-07-27 DIAGNOSIS — I10 Essential (primary) hypertension: Secondary | ICD-10-CM | POA: Diagnosis not present

## 2012-07-27 DIAGNOSIS — H409 Unspecified glaucoma: Secondary | ICD-10-CM | POA: Diagnosis not present

## 2012-07-27 DIAGNOSIS — J309 Allergic rhinitis, unspecified: Secondary | ICD-10-CM | POA: Diagnosis not present

## 2012-08-07 DIAGNOSIS — H4011X0 Primary open-angle glaucoma, stage unspecified: Secondary | ICD-10-CM | POA: Diagnosis not present

## 2012-08-07 DIAGNOSIS — H409 Unspecified glaucoma: Secondary | ICD-10-CM | POA: Diagnosis not present

## 2012-08-07 DIAGNOSIS — H2510 Age-related nuclear cataract, unspecified eye: Secondary | ICD-10-CM | POA: Diagnosis not present

## 2012-08-07 DIAGNOSIS — H44419 Flat anterior chamber hypotony of unspecified eye: Secondary | ICD-10-CM | POA: Diagnosis not present

## 2012-10-04 DIAGNOSIS — H1830 Unspecified corneal membrane change: Secondary | ICD-10-CM | POA: Diagnosis not present

## 2012-11-22 DIAGNOSIS — H4011X0 Primary open-angle glaucoma, stage unspecified: Secondary | ICD-10-CM | POA: Diagnosis not present

## 2012-11-25 DIAGNOSIS — Z961 Presence of intraocular lens: Secondary | ICD-10-CM | POA: Diagnosis not present

## 2012-11-25 DIAGNOSIS — H4011X0 Primary open-angle glaucoma, stage unspecified: Secondary | ICD-10-CM | POA: Diagnosis not present

## 2012-11-25 DIAGNOSIS — H2510 Age-related nuclear cataract, unspecified eye: Secondary | ICD-10-CM | POA: Diagnosis not present

## 2012-11-29 DIAGNOSIS — Z961 Presence of intraocular lens: Secondary | ICD-10-CM | POA: Diagnosis not present

## 2012-11-29 DIAGNOSIS — H4011X0 Primary open-angle glaucoma, stage unspecified: Secondary | ICD-10-CM | POA: Diagnosis not present

## 2012-11-29 DIAGNOSIS — H2510 Age-related nuclear cataract, unspecified eye: Secondary | ICD-10-CM | POA: Diagnosis not present

## 2012-12-27 DIAGNOSIS — Z961 Presence of intraocular lens: Secondary | ICD-10-CM | POA: Diagnosis not present

## 2012-12-27 DIAGNOSIS — H2510 Age-related nuclear cataract, unspecified eye: Secondary | ICD-10-CM | POA: Diagnosis not present

## 2012-12-27 DIAGNOSIS — H4011X0 Primary open-angle glaucoma, stage unspecified: Secondary | ICD-10-CM | POA: Diagnosis not present

## 2012-12-27 DIAGNOSIS — H209 Unspecified iridocyclitis: Secondary | ICD-10-CM | POA: Diagnosis not present

## 2013-01-17 DIAGNOSIS — H4010X0 Unspecified open-angle glaucoma, stage unspecified: Secondary | ICD-10-CM | POA: Diagnosis not present

## 2013-01-17 DIAGNOSIS — H2510 Age-related nuclear cataract, unspecified eye: Secondary | ICD-10-CM | POA: Diagnosis not present

## 2013-01-17 DIAGNOSIS — H209 Unspecified iridocyclitis: Secondary | ICD-10-CM | POA: Diagnosis not present

## 2013-01-17 DIAGNOSIS — Z961 Presence of intraocular lens: Secondary | ICD-10-CM | POA: Diagnosis not present

## 2013-01-20 DIAGNOSIS — H4010X0 Unspecified open-angle glaucoma, stage unspecified: Secondary | ICD-10-CM | POA: Diagnosis not present

## 2013-01-20 DIAGNOSIS — H409 Unspecified glaucoma: Secondary | ICD-10-CM | POA: Diagnosis not present

## 2013-01-20 DIAGNOSIS — H2510 Age-related nuclear cataract, unspecified eye: Secondary | ICD-10-CM | POA: Diagnosis not present

## 2013-01-26 IMAGING — MG MM DIGITAL SCREENING BILAT
4 series · 4 of 4 positions shown · non-contrast
Comparison: none

DG SCREEN MAMMOGRAM BILATERAL
Bilateral CC and MLO view(s) were taken.

DIGITAL SCREENING MAMMOGRAM WITH CAD:
The breast tissue is heterogeneously dense.  A possible mass is noted in the left breast.  Spot 
compression views and possibly sonography are recommended for further evaluation.  In the right 
breast, no masses or malignant type calcifications are identified.
Images were processed with CAD.

[R CC]
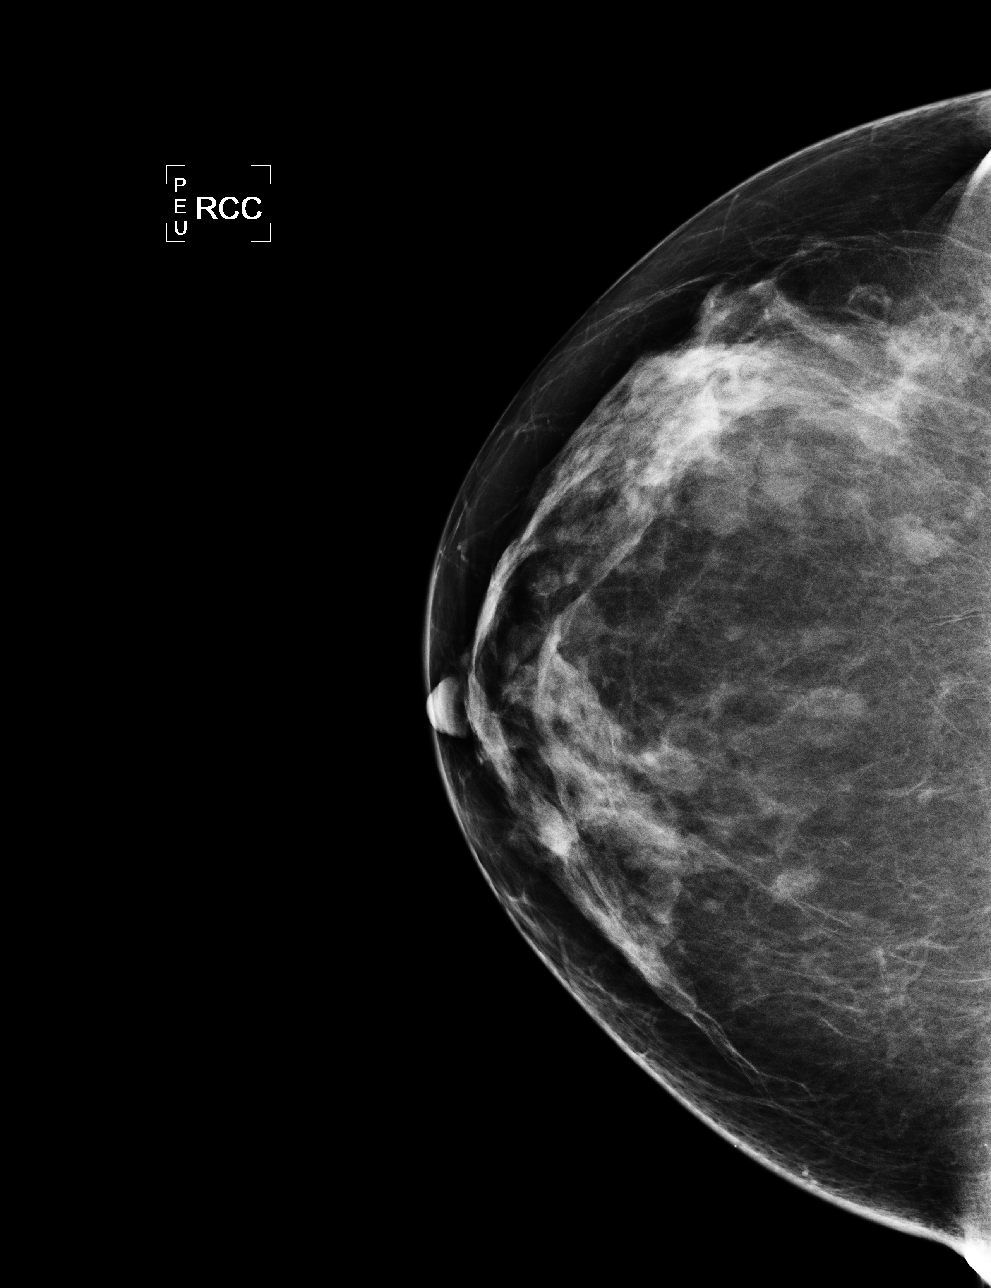

[L CC]
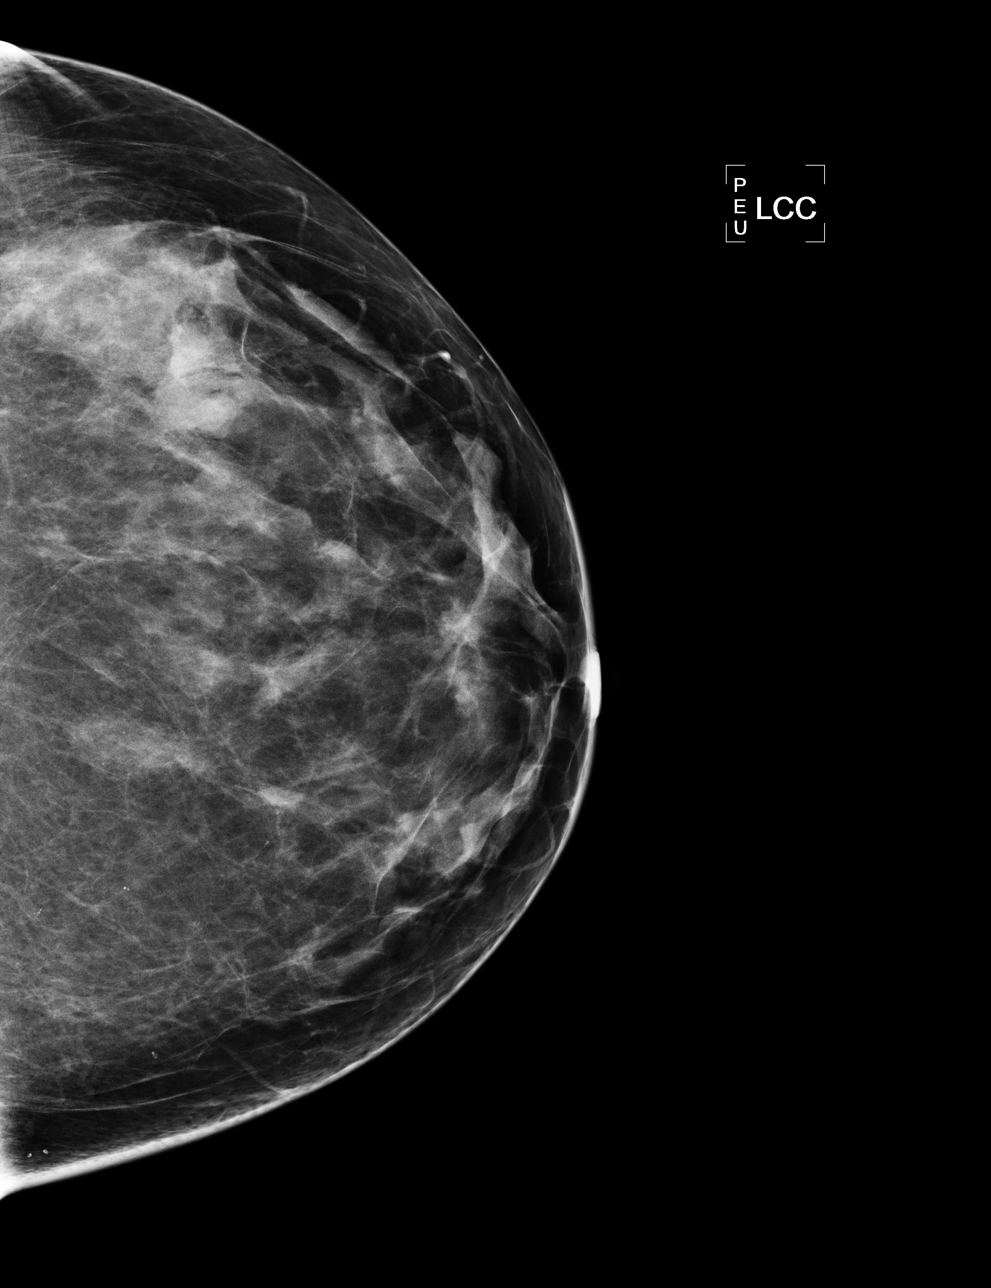

[L MLO]
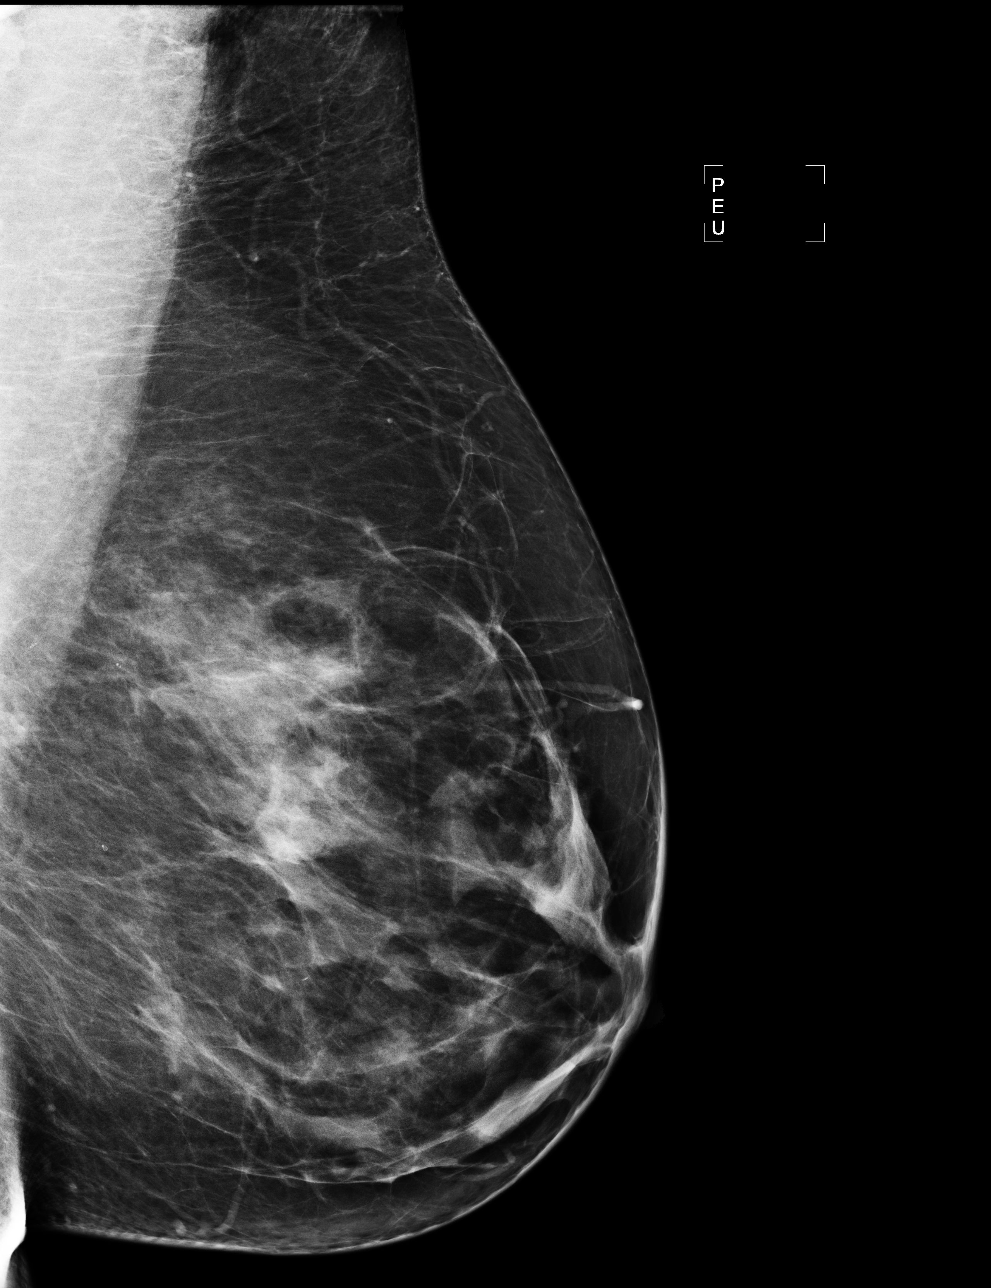

[R MLO]
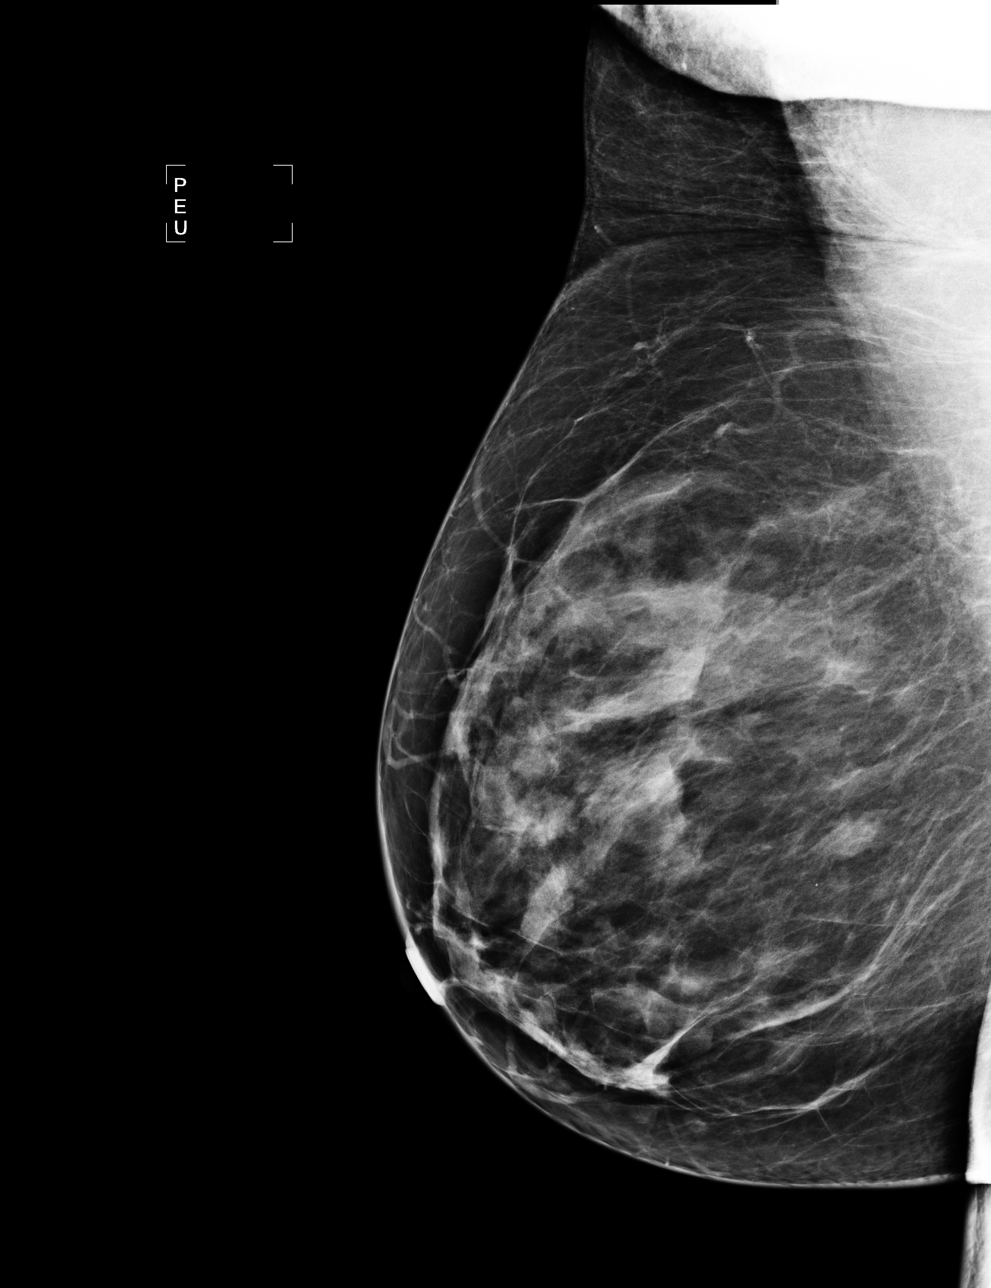

[4 of 4 positions shown; findings below may reference images not displayed]

IMPRESSION: Possible mass, left breast.  Additional evaluation is indicated.  The patient will be contacted for
additional studies and a supplementary report will follow.  No specific mammographic evidence of 
malignancy, right breast.

ASSESSMENT: Need additional imaging evaluation and/or prior mammograms for comparison - BI-RADS 0

Further imaging of the left breast.
,

## 2013-02-02 DIAGNOSIS — Z01818 Encounter for other preprocedural examination: Secondary | ICD-10-CM | POA: Diagnosis not present

## 2013-02-21 IMAGING — MG MM DIAGNOSTIC UNILATERAL L
2 series · 2 of 2 positions shown · non-contrast
Comparison: [DATE] [DATE], [DATE], [DATE] [DATE], [DATE]

CLINICAL DATA: Status post ultrasound guided core biopsy left
breast

DIGITAL DIAGNOSTIC LEFT MAMMOGRAM

[L CC]
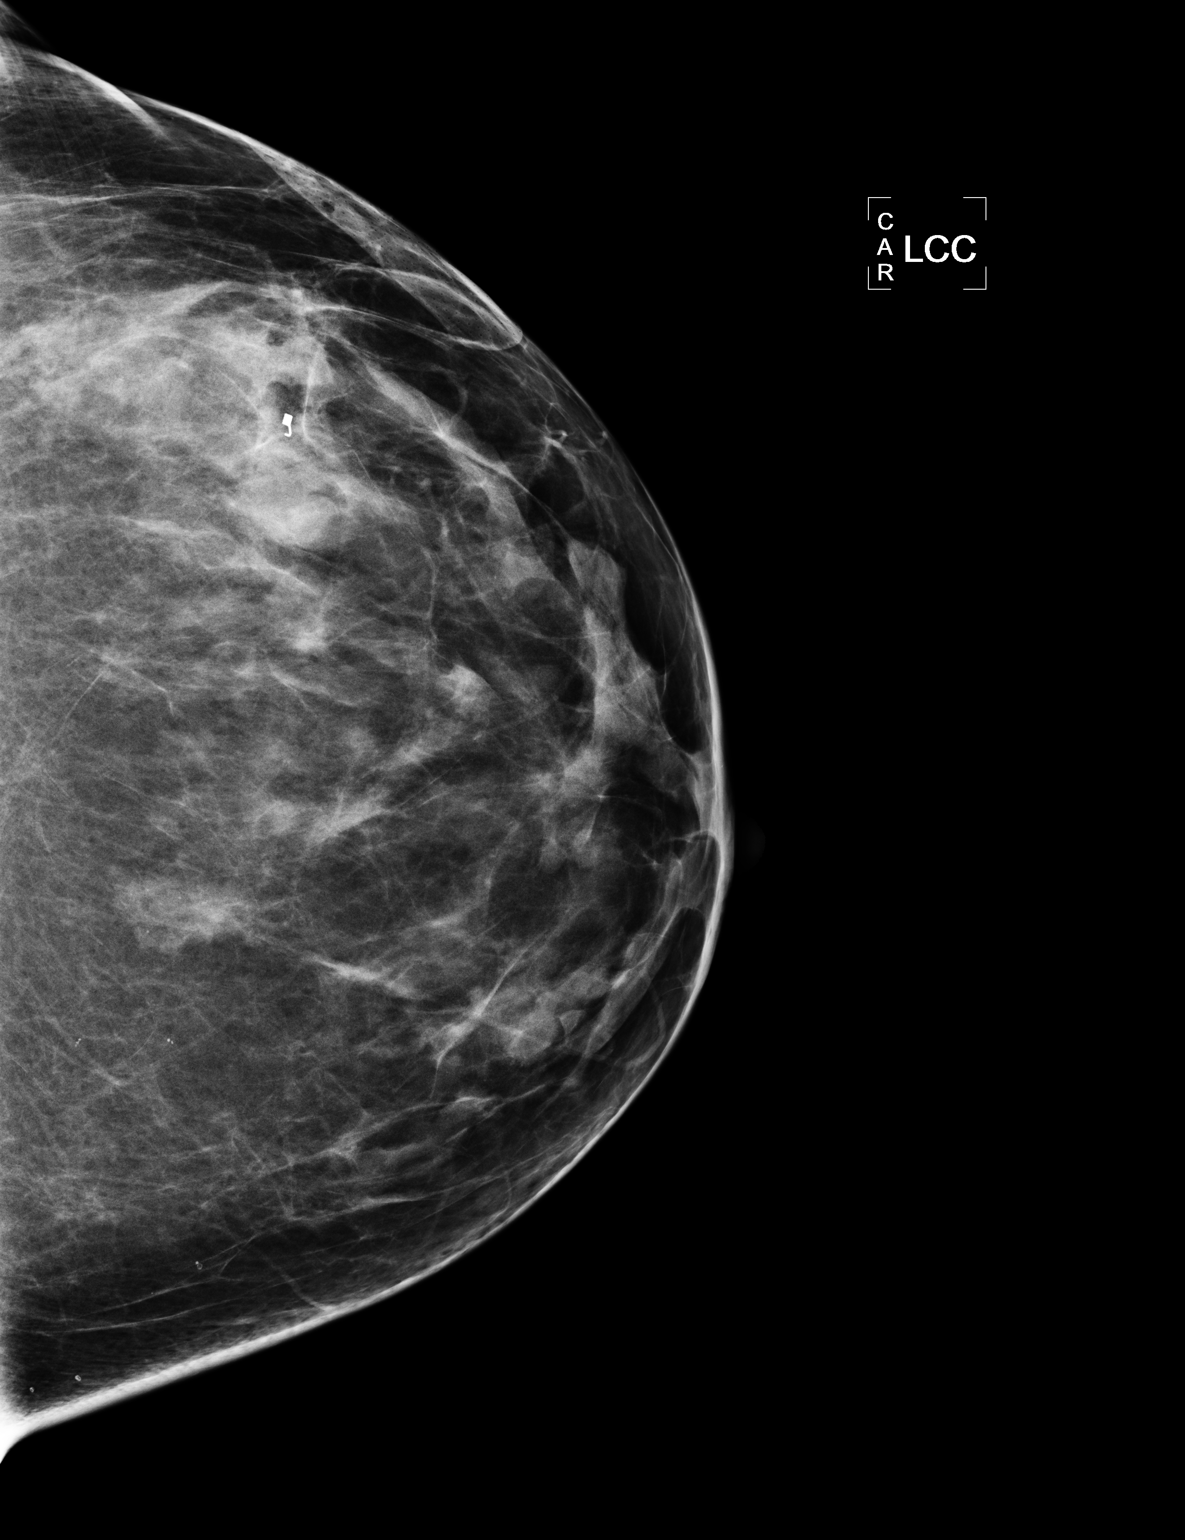

[L ML]
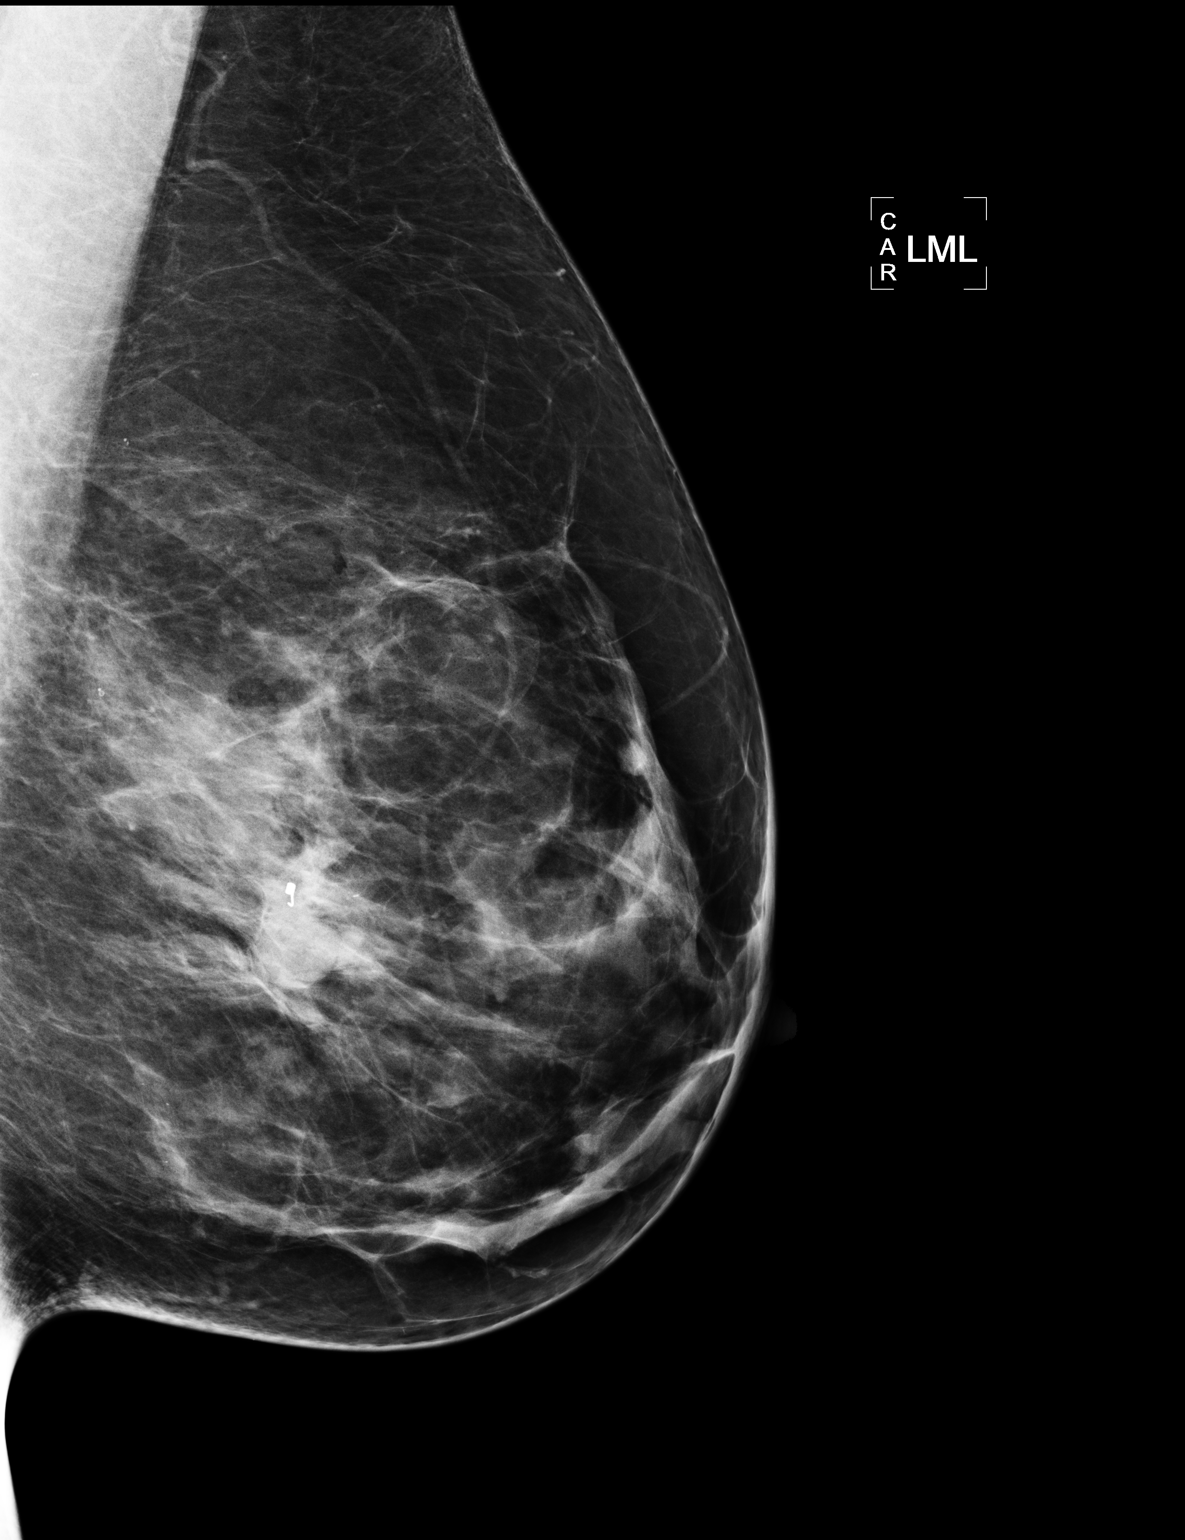

[2 of 2 positions shown; findings below may reference images not displayed]

FINDINGS: Films are performed following left breast ultrasound
guided biopsy of hypoechoic lesion at the 3 o'clock position.  CC
and lateral view of the left breast demonstrate biopsy clip in the
area of concern.
IMPRESSION: Post biopsy mammogram demonstrating biopsy clip in the area of
concern.

## 2013-02-23 DIAGNOSIS — H4011X0 Primary open-angle glaucoma, stage unspecified: Secondary | ICD-10-CM | POA: Diagnosis not present

## 2013-02-23 DIAGNOSIS — H2510 Age-related nuclear cataract, unspecified eye: Secondary | ICD-10-CM | POA: Diagnosis not present

## 2013-02-23 DIAGNOSIS — H4010X0 Unspecified open-angle glaucoma, stage unspecified: Secondary | ICD-10-CM | POA: Diagnosis not present

## 2013-02-23 DIAGNOSIS — Z79899 Other long term (current) drug therapy: Secondary | ICD-10-CM | POA: Diagnosis not present

## 2013-02-23 DIAGNOSIS — H409 Unspecified glaucoma: Secondary | ICD-10-CM | POA: Diagnosis not present

## 2013-03-04 DIAGNOSIS — H401190 Primary open-angle glaucoma, unspecified eye, stage unspecified: Secondary | ICD-10-CM | POA: Insufficient documentation

## 2013-03-31 ENCOUNTER — Other Ambulatory Visit (INDEPENDENT_AMBULATORY_CARE_PROVIDER_SITE_OTHER): Payer: Medicare Other

## 2013-03-31 ENCOUNTER — Ambulatory Visit (INDEPENDENT_AMBULATORY_CARE_PROVIDER_SITE_OTHER): Payer: Medicare Other | Admitting: Internal Medicine

## 2013-03-31 ENCOUNTER — Encounter: Payer: Self-pay | Admitting: Internal Medicine

## 2013-03-31 VITALS — BP 152/90 | HR 70 | Temp 98.4°F | Wt 166.6 lb

## 2013-03-31 DIAGNOSIS — R7309 Other abnormal glucose: Secondary | ICD-10-CM

## 2013-03-31 DIAGNOSIS — I10 Essential (primary) hypertension: Secondary | ICD-10-CM | POA: Insufficient documentation

## 2013-03-31 DIAGNOSIS — R739 Hyperglycemia, unspecified: Secondary | ICD-10-CM

## 2013-03-31 DIAGNOSIS — Z0000 Encounter for general adult medical examination without abnormal findings: Secondary | ICD-10-CM

## 2013-03-31 DIAGNOSIS — H409 Unspecified glaucoma: Secondary | ICD-10-CM | POA: Diagnosis not present

## 2013-03-31 LAB — URINALYSIS, ROUTINE W REFLEX MICROSCOPIC
Bilirubin Urine: NEGATIVE
HGB URINE DIPSTICK: NEGATIVE
Ketones, ur: NEGATIVE
Leukocytes, UA: NEGATIVE
Nitrite: NEGATIVE
RBC / HPF: NONE SEEN (ref 0–?)
SPECIFIC GRAVITY, URINE: 1.015 (ref 1.000–1.030)
TOTAL PROTEIN, URINE-UPE24: NEGATIVE
URINE GLUCOSE: NEGATIVE
Urobilinogen, UA: 0.2 (ref 0.0–1.0)
WBC UA: NONE SEEN (ref 0–?)
pH: 7 (ref 5.0–8.0)

## 2013-03-31 LAB — CBC WITH DIFFERENTIAL/PLATELET
BASOS PCT: 0.4 % (ref 0.0–3.0)
Basophils Absolute: 0 10*3/uL (ref 0.0–0.1)
EOS PCT: 1 % (ref 0.0–5.0)
Eosinophils Absolute: 0.1 10*3/uL (ref 0.0–0.7)
HCT: 41.2 % (ref 36.0–46.0)
Hemoglobin: 13.6 g/dL (ref 12.0–15.0)
LYMPHS PCT: 28.1 % (ref 12.0–46.0)
Lymphs Abs: 1.9 10*3/uL (ref 0.7–4.0)
MCHC: 33 g/dL (ref 30.0–36.0)
MCV: 92.6 fl (ref 78.0–100.0)
Monocytes Absolute: 0.5 10*3/uL (ref 0.1–1.0)
Monocytes Relative: 7.4 % (ref 3.0–12.0)
NEUTROS ABS: 4.2 10*3/uL (ref 1.4–7.7)
Neutrophils Relative %: 63.1 % (ref 43.0–77.0)
Platelets: 271 10*3/uL (ref 150.0–400.0)
RBC: 4.45 Mil/uL (ref 3.87–5.11)
RDW: 12.3 % (ref 11.5–14.6)
WBC: 6.7 10*3/uL (ref 4.5–10.5)

## 2013-03-31 LAB — BASIC METABOLIC PANEL
BUN: 16 mg/dL (ref 6–23)
CALCIUM: 9.6 mg/dL (ref 8.4–10.5)
CO2: 27 mEq/L (ref 19–32)
Chloride: 106 mEq/L (ref 96–112)
Creatinine, Ser: 0.9 mg/dL (ref 0.4–1.2)
GFR: 76.89 mL/min (ref >60.00)
Glucose, Bld: 90 mg/dL (ref 70–99)
Potassium: 3.9 mEq/L (ref 3.5–5.1)
SODIUM: 138 meq/L (ref 135–145)

## 2013-03-31 LAB — LIPID PANEL
Cholesterol: 176 mg/dL (ref 0–200)
HDL: 52.5 mg/dL (ref >39.00)
LDL Cholesterol: 107 mg/dL — ABNORMAL HIGH (ref 0–99)
Total CHOL/HDL Ratio: 3
Triglycerides: 84 mg/dL (ref 0.0–149.0)
VLDL: 16.8 mg/dL (ref 0.0–40.0)

## 2013-03-31 LAB — HEMOGLOBIN A1C: HEMOGLOBIN A1C: 6 % (ref 4.6–6.5)

## 2013-03-31 LAB — TSH: TSH: 0.9 u[IU]/mL (ref 0.35–5.50)

## 2013-03-31 MED ORDER — LOSARTAN POTASSIUM-HCTZ 100-25 MG PO TABS: 1.0000 | ORAL_TABLET | Freq: Every day | ORAL | Status: DC

## 2013-03-31 NOTE — Progress Notes (Signed)
Subjective:    Patient ID: Melissa Warren, female    DOB: 01-03-1934, 80 y.o.   MRN: [id number]  Hypertension This is a recurrent problem. The current episode started more than 1 month ago. The problem has been waxing and waning since onset. Pertinent negatives include no chest pain, headaches, palpitations, peripheral edema or shortness of breath. There are no associated agents to hypertension. Risk factors for coronary artery disease include family history. Past treatments include nothing. Compliance problems include diet.  There is no history of angina, kidney disease, CAD/MI, CVA, heart failure or retinopathy.   Also here for medicare wellness  Diet: heart healthy Physical activity: sedentary Depression/mood screen: negative Hearing: intact to whispered voice Visual acuity: grossly normal, performs eye exam with optho [name] q6-5370mo for glaucoma ADLs: capable Fall risk: none Home safety: good Cognitive evaluation: intact to orientation, naming, recall and repetition EOL planning: adv directives, full code/ I agree  I have personally reviewed and have noted 1. The patient's medical and social history 2. Their use of alcohol, tobacco or illicit drugs 3. Their current medications and supplements 4. The patient's functional ability including ADL's, fall risks, home safety risks and hearing or visual impairment. 5. Diet and physical activities 6. Evidence for depression or mood disorders  Past Medical History  Diagnosis Date  . Glaucoma   . Blood transfusion 1977    after hysterectomy  . Cataract   . Constipation     Family History  Problem Relation Age of Onset  . Colon cancer Sister 384  . Prostate cancer Paternal Grandfather   . Stroke Maternal Grandmother   . Hypertension Maternal Grandmother   . Diabetes Maternal Grandmother   . Diabetes Mother   . Diabetes Brother   . Diabetes Brother   . Diabetes Brother   . Diabetes Sister   . Diabetes Sister   .  Diabetes Sister    History  Substance Use Topics  . Smoking status: Former Games developermoker  . Smokeless tobacco: Never Used  . Alcohol Use: No   Review of Systems  Constitutional: Negative for fatigue and unexpected weight change.  Respiratory: Negative for cough, shortness of breath and wheezing.   Cardiovascular: Negative for chest pain, palpitations and leg swelling.  Gastrointestinal: Negative for nausea, abdominal pain and diarrhea.  Neurological: Negative for dizziness, weakness, light-headedness and headaches.  Psychiatric/Behavioral: Negative for dysphoric mood. The patient is not nervous/anxious.   All other systems reviewed and are negative.       Objective:   Physical Exam BP 152/90  Pulse 70  Temp(Src) 98.4 F (36.9 C) (Oral)  Wt 166 lb 9.6 oz (75.569 kg)  SpO2 98% Wt Readings from Last 3 Encounters:  03/31/13 166 lb 9.6 oz (75.569 kg)  03/22/12 158 lb 1.9 oz (71.723 kg)  04/07/11 166 lb (75.297 kg)   Constitutional: She appears well-developed and well-nourished. No distress.  HENT: Head: Normocephalic and atraumatic. Ears: B TMs ok, no erythema or effusion; Nose: Nose normal.  Mouth/Throat: Oropharynx is clear and moist. No oropharyngeal exudate.  Eyes: R eye with min edema/swelling compared to L - Conjunctivae and EOM are normal. Pupils are equal, round, and reactive to light. No scleral icterus.  Neck: Normal range of motion. Neck supple. No JVD present. No thyromegaly present.  Cardiovascular: Normal rate, regular rhythm and normal heart sounds.  No murmur heard. No BLE edema. Pulmonary/Chest: Effort normal and breath sounds normal. No respiratory distress. She has no wheezes.  Abdominal: Soft. Bowel sounds  are normal. She exhibits no distension. There is no tenderness. no masses Musculoskeletal: Normal range of motion, no joint effusions. No gross deformities Neurological: She is alert and oriented to person, place, and time. No cranial nerve deficit. Coordination  normal.  Skin: Skin is warm and dry. No rash noted. No erythema.  Psychiatric: She has a normal mood and affect. Her behavior is normal. Judgment and thought content normal.   Lab Results  Component Value Date   WBC 4.9 03/22/2012   HGB 13.0 03/22/2012   HCT 38.8 03/22/2012   PLT 280.0 03/22/2012   GLUCOSE 95 03/22/2012   CHOL 155 03/22/2012   TRIG 69.0 03/22/2012   HDL 49.90 03/22/2012   LDLCALC 91 03/22/2012   ALT 11 03/22/2012   AST 19 03/22/2012   NA 138 03/22/2012   K 4.9 03/22/2012   CL 103 03/22/2012   CREATININE 0.9 03/22/2012   BUN 15 03/22/2012   CO2 30 03/22/2012   TSH 0.67 03/22/2012   HGBA1C 5.8 03/22/2012       Assessment & Plan:   AWV - v70.0 - Today patient counseled on age appropriate routine health concerns for screening and prevention, each reviewed and up to date or declined. Immunizations reviewed and up to date or declined. Labs ordered and reviewed. Risk factors for depression reviewed and negative. Hearing function and visual acuity are intact. ADLs screened and addressed as needed. Functional ability and level of safety reviewed and appropriate. Education, counseling and referrals performed based on assessed risks today. Patient provided with a copy of personalized plan for preventive services.  Problem List Items Addressed This Visit   Glaucoma     Interval hx reviewed - surgeries for glaucoma and cataracts B in 2014 reviewed continue drops and follow up as planned    Relevant Medications      prednisoLONE acetate (PRED FORTE) 1 % ophthalmic suspension   Hyperglycemia      Strong FH same Check a1c Lab Results  Component Value Date   HGBA1C 5.8 03/22/2012      Relevant Orders      Basic metabolic panel      CBC with Differential      Lipid panel      TSH      Urinalysis, Routine w reflex microscopic      Hemoglobin A1c   Hypertension      BP Readings from Last 3 Encounters:  03/31/13 152/90  03/22/12 142/90  04/07/11 182/99   Reports high BP over  past 11mo noted at [facility] Will start ARB+HCT qd today - erx done we reviewed potential risk/benefit and possible side effects - pt understands and agrees to same  Pt to monitor at home and call if SBP>140/90 and follow up in 2-4 weeks for titration as needed    Relevant Medications      LOSARTAN POTASSIUM-HCTZ 100-25 MG PO TABS   Other Relevant Orders      Basic metabolic panel      CBC with Differential      Lipid panel      TSH      Urinalysis, Routine w reflex microscopic    Other Visit Diagnoses   Routine general medical examination at a health care facility    -  Primary    Relevant Orders       Basic metabolic panel       CBC with Differential       Lipid panel       TSH  Urinalysis, Routine w reflex microscopic

## 2013-03-31 NOTE — Assessment & Plan Note (Signed)
BP Readings from Last 3 Encounters:  03/31/13 152/90  03/22/12 142/90  04/07/11 182/99   [facility] Will start ARB+HCT qd today - erx done we reviewed potential risk/benefit and possible side effects - pt understands and agrees to same  Pt to monitor at home and call if SBP>140/90 and follow up in 2-4 weeks for titration as needed

## 2013-03-31 NOTE — Progress Notes (Signed)
Pre-visit discussion using our clinic review tool. No additional management support is needed unless otherwise documented below in the visit note.  

## 2013-03-31 NOTE — Assessment & Plan Note (Signed)
Strong FH same Check a1c Lab Results  Component Value Date   HGBA1C 5.8 03/22/2012

## 2013-03-31 NOTE — Assessment & Plan Note (Signed)
Interval hx reviewed - surgeries for glaucoma and cataracts B in 2014 reviewed continue drops and follow up as planned

## 2013-03-31 NOTE — Patient Instructions (Addendum)
It was good to see you today.  We have reviewed your prior records including labs and tests today  Health Maintenance reviewed - all recommended immunizations and age-appropriate screenings are up-to-date.  Test(s) ordered today. Your results will be called to you after review (48-72hours after test completion). If any changes need to be made, you will be notified at that time.  Medications reviewed and updated -  start generic Hyzaar once daily for blood pressure control - no other changes  Your prescription(s) have been submitted to your pharmacy. Please take as directed and contact our office if you believe you are having problem(s) with the medication(s).  Please schedule followup in 2-4 weeks for blood pressure recheck, call sooner if problems.   Health Maintenance, Female A healthy lifestyle and preventative care can promote health and wellness.  Maintain regular health, dental, and eye exams.  Eat a healthy diet. Foods like vegetables, fruits, whole grains, low-fat dairy products, and lean protein foods contain the nutrients you need without too many calories. Decrease your intake of foods high in solid fats, added sugars, and salt. Get information about a proper diet from your caregiver, if necessary.  Regular physical exercise is one of the most important things you can do for your health. Most adults should get at least 150 minutes of moderate-intensity exercise (any activity that increases your heart rate and causes you to sweat) each week. In addition, most adults need muscle-strengthening exercises on 2 or more days a week.   Maintain a healthy weight. The body mass index (BMI) is a screening tool to identify possible weight problems. It provides an estimate of body fat based on height and weight. Your caregiver can help determine your BMI, and can help you achieve or maintain a healthy weight. For adults 20 years and older:  A BMI below 18.5 is considered underweight.  A  BMI of 18.5 to 24.9 is normal.  A BMI of 25 to 29.9 is considered overweight.  A BMI of 30 and above is considered obese.  Maintain normal blood lipids and cholesterol by exercising and minimizing your intake of saturated fat. Eat a balanced diet with plenty of fruits and vegetables. Blood tests for lipids and cholesterol should begin at age 90 and be repeated every 5 years. If your lipid or cholesterol levels are high, you are over 50, or you are a high risk for heart disease, you may need your cholesterol levels checked more frequently.Ongoing high lipid and cholesterol levels should be treated with medicines if diet and exercise are not effective.  If you smoke, find out from your caregiver how to quit. If you do not use tobacco, do not start.  Lung cancer screening is recommended for adults aged 38 80 years who are at high risk for developing lung cancer because of a history of smoking. Yearly low-dose computed tomography (CT) is recommended for people who have at least a 30-pack-year history of smoking and are a current smoker or have quit within the past 15 years. A pack year of smoking is smoking an average of 1 pack of cigarettes a day for 1 year (for example: 1 pack a day for 30 years or 2 packs a day for 15 years). Yearly screening should continue until the smoker has stopped smoking for at least 15 years. Yearly screening should also be stopped for people who develop a health problem that would prevent them from having lung cancer treatment.  If you are pregnant, do not drink alcohol.  If you are breastfeeding, be very cautious about drinking alcohol. If you are not pregnant and choose to drink alcohol, do not exceed 1 drink per day. One drink is considered to be 12 ounces (355 mL) of beer, 5 ounces (148 mL) of wine, or 1.5 ounces (44 mL) of liquor.  Avoid use of street drugs. Do not share needles with anyone. Ask for help if you need support or instructions about stopping the use of  drugs.  High blood pressure causes heart disease and increases the risk of stroke. Blood pressure should be checked at least every 1 to 2 years. Ongoing high blood pressure should be treated with medicines, if weight loss and exercise are not effective.  If you are 71 to 78 years old, ask your caregiver if you should take aspirin to prevent strokes.  Diabetes screening involves taking a blood sample to check your fasting blood sugar level. This should be done once every 3 years, after age 77, if you are within normal weight and without risk factors for diabetes. Testing should be considered at a younger age or be carried out more frequently if you are overweight and have at least 1 risk factor for diabetes.  Breast cancer screening is essential preventative care for women. You should practice "breast self-awareness." This means understanding the normal appearance and feel of your breasts and may include breast self-examination. Any changes detected, no matter how small, should be reported to a caregiver. Women in their 59s and 30s should have a clinical breast exam (CBE) by a caregiver as part of a regular health exam every 1 to 3 years. After age 25, women should have a CBE every year. Starting at age 76, women should consider having a mammogram (breast X-ray) every year. Women who have a family history of breast cancer should talk to their caregiver about genetic screening. Women at a high risk of breast cancer should talk to their caregiver about having an MRI and a mammogram every year.  Breast cancer gene (BRCA)-related cancer risk assessment is recommended for women who have family members with BRCA-related cancers. BRCA-related cancers include breast, ovarian, tubal, and peritoneal cancers. Having family members with these cancers may be associated with an increased risk for harmful changes (mutations) in the breast cancer genes BRCA1 and BRCA2. Results of the assessment will determine the need for  genetic counseling and BRCA1 and BRCA2 testing.  The Pap test is a screening test for cervical cancer. Women should have a Pap test starting at age 67. Between ages 60 and 35, Pap tests should be repeated every 2 years. Beginning at age 106, you should have a Pap test every 3 years as long as the past 3 Pap tests have been normal. If you had a hysterectomy for a problem that was not cancer or a condition that could lead to cancer, then you no longer need Pap tests. If you are between ages 40 and 74, and you have had normal Pap tests going back 10 years, you no longer need Pap tests. If you have had past treatment for cervical cancer or a condition that could lead to cancer, you need Pap tests and screening for cancer for at least 20 years after your treatment. If Pap tests have been discontinued, risk factors (such as a new sexual partner) need to be reassessed to determine if screening should be resumed. Some women have medical problems that increase the chance of getting cervical cancer. In these cases, your caregiver may recommend  more frequent screening and Pap tests.  The human papillomavirus (HPV) test is an additional test that may be used for cervical cancer screening. The HPV test looks for the virus that can cause the cell changes on the cervix. The cells collected during the Pap test can be tested for HPV. The HPV test could be used to screen women aged 8 years and older, and should be used in women of any age who have unclear Pap test results. After the age of 39, women should have HPV testing at the same frequency as a Pap test.  Colorectal cancer can be detected and often prevented. Most routine colorectal cancer screening begins at the age of 45 and continues through age 25. However, your caregiver may recommend screening at an earlier age if you have risk factors for colon cancer. On a yearly basis, your caregiver may provide home test kits to check for hidden blood in the stool. Use of a small  camera at the end of a tube, to directly examine the colon (sigmoidoscopy or colonoscopy), can detect the earliest forms of colorectal cancer. Talk to your caregiver about this at age 26, when routine screening begins. Direct examination of the colon should be repeated every 5 to 10 years through age 24, unless early forms of pre-cancerous polyps or small growths are found.  Hepatitis C blood testing is recommended for all people born from [birth year] through 1963 and any individual with known risks for hepatitis C.  Practice safe sex. Use condoms and avoid high-risk sexual practices to reduce the spread of sexually transmitted infections (STIs). Sexually active women aged 8 and younger should be checked for Chlamydia, which is a common sexually transmitted infection. Older women with new or multiple partners should also be tested for Chlamydia. Testing for other STIs is recommended if you are sexually active and at increased risk.  Osteoporosis is a disease in which the bones lose minerals and strength with aging. This can result in serious bone fractures. The risk of osteoporosis can be identified using a bone density scan. Women ages 42 and over and women at risk for fractures or osteoporosis should discuss screening with their caregivers. Ask your caregiver whether you should be taking a calcium supplement or vitamin D to reduce the rate of osteoporosis.  Menopause can be associated with physical symptoms and risks. Hormone replacement therapy is available to decrease symptoms and risks. You should talk to your caregiver about whether hormone replacement therapy is right for you.  Use sunscreen. Apply sunscreen liberally and repeatedly throughout the day. You should seek shade when your shadow is shorter than you. Protect yourself by wearing long sleeves, pants, a wide-brimmed hat, and sunglasses year round, whenever you are outdoors.  Notify your caregiver of new moles or changes in moles, especially if  there is a change in shape or color. Also notify your caregiver if a mole is larger than the size of a pencil eraser.  Stay current with your immunizations. Document Released: 08/12/2010 Document Revised: 05/24/2012 Document Reviewed: 08/12/2010 [facility] Patient Information 2014 [place]. DASH Diet The DASH diet stands for "Dietary Approaches to Stop Hypertension." It is a healthy eating plan that has been shown to reduce high blood pressure (hypertension) in as little as 14 days, while also possibly providing other significant health benefits. These other health benefits include reducing the risk of breast cancer after menopause and reducing the risk of type 2 diabetes, heart disease, colon cancer, and stroke. Health benefits also include  weight loss and slowing kidney failure in patients with chronic kidney disease.  DIET GUIDELINES  Limit salt (sodium). Your diet should contain less than 1500 mg of sodium daily.  Limit refined or processed carbohydrates. Your diet should include mostly whole grains. Desserts and added sugars should be used sparingly.  Include small amounts of heart-healthy fats. These types of fats include nuts, oils, and tub margarine. Limit saturated and trans fats. These fats have been shown to be harmful in the body. CHOOSING FOODS  The following food groups are based on a 2000 calorie diet. See your Registered Dietitian for individual calorie needs. Grains and Grain Products (6 to 8 servings daily)  Eat More Often: Whole-wheat bread, brown rice, whole-grain or wheat pasta, quinoa, popcorn without added fat or salt (air popped).  Eat Less Often: White bread, white pasta, white rice, cornbread. Vegetables (4 to 5 servings daily)  Eat More Often: Fresh, frozen, and canned vegetables. Vegetables may be raw, steamed, roasted, or grilled with a minimal amount of fat.  Eat Less Often/Avoid: Creamed or fried vegetables. Vegetables in a cheese sauce. Fruit (4 to 5  servings daily)  Eat More Often: All fresh, canned (in natural juice), or frozen fruits. Dried fruits without added sugar. One hundred percent fruit juice ( cup [237 mL] daily).  Eat Less Often: Dried fruits with added sugar. Canned fruit in light or heavy syrup. [employer], Fish, and Poultry (2 servings or less daily. One serving is 3 to 4 oz [85-114 g]).  Eat More Often: Ninety percent or leaner ground beef, tenderloin, sirloin. Round cuts of beef, chicken breast, [place] breast. All fish. Grill, bake, or broil your meat. Nothing should be fried.  Eat Less Often/Avoid: Fatty cuts of meat, [place], or chicken leg, thigh, or wing. Fried cuts of meat or fish. Dairy (2 to 3 servings)  Eat More Often: Low-fat or fat-free milk, low-fat plain or light yogurt, reduced-fat or part-skim cheese.  Eat Less Often/Avoid: Milk (whole, 2%).Whole milk yogurt. Full-fat cheeses. Nuts, Seeds, and Legumes (4 to 5 servings per week)  Eat More Often: All without added salt.  Eat Less Often/Avoid: Salted nuts and seeds, canned beans with added salt. Fats and Sweets (limited)  Eat More Often: Vegetable oils, tub margarines without trans fats, sugar-free gelatin. Mayonnaise and salad dressings.  Eat Less Often/Avoid: Coconut oils, palm oils, butter, stick margarine, cream, half and half, cookies, candy, pie. FOR MORE INFORMATION The Dash Diet Eating Plan: www.dashdiet.org Document Released: 01/16/2011 Document Revised: 04/21/2011 Document Reviewed: 01/16/2011 [facility] Patient Information 2014 [place], [place].

## 2013-04-08 ENCOUNTER — Other Ambulatory Visit: Payer: Self-pay

## 2013-04-08 DIAGNOSIS — Z1231 Encounter for screening mammogram for malignant neoplasm of breast: Secondary | ICD-10-CM

## 2013-04-11 ENCOUNTER — Ambulatory Visit
Admission: RE | Admit: 2013-04-11 | Discharge: 2013-04-11 | Disposition: A | Discharge status: Home / Self Care | Payer: Medicare Other | Source: Ambulatory Visit

## 2013-04-11 DIAGNOSIS — Z1231 Encounter for screening mammogram for malignant neoplasm of breast: Secondary | ICD-10-CM

## 2013-04-21 DIAGNOSIS — H409 Unspecified glaucoma: Secondary | ICD-10-CM | POA: Diagnosis not present

## 2013-04-27 ENCOUNTER — Other Ambulatory Visit: Payer: Self-pay

## 2013-04-27 ENCOUNTER — Encounter (HOSPITAL_COMMUNITY): Payer: Self-pay | Admitting: Emergency Medicine

## 2013-04-27 ENCOUNTER — Emergency Department (INDEPENDENT_AMBULATORY_CARE_PROVIDER_SITE_OTHER)
Admission: EM | Admit: 2013-04-27 | Discharge: 2013-04-27 | Disposition: A | Discharge status: Home / Self Care | Payer: Medicare Other | Source: Home / Self Care | Attending: Emergency Medicine | Admitting: Emergency Medicine

## 2013-04-27 DIAGNOSIS — I10 Essential (primary) hypertension: Secondary | ICD-10-CM

## 2013-04-27 LAB — POCT I-STAT, CHEM 8
BUN: 11 mg/dL (ref 6–23)
CREATININE: 1 mg/dL (ref 0.50–1.10)
Calcium, Ion: 1.27 mmol/L (ref 1.13–1.30)
Chloride: 103 mEq/L (ref 96–112)
Glucose, Bld: 109 mg/dL — ABNORMAL HIGH (ref 70–99)
HCT: 43 % (ref 36.0–46.0)
HEMOGLOBIN: 14.6 g/dL (ref 12.0–15.0)
Potassium: 3.9 mEq/L (ref 3.7–5.3)
Sodium: 143 mEq/L (ref 137–147)
TCO2: 27 mmol/L (ref 0–100)

## 2013-04-27 MED ORDER — CLONIDINE HCL 0.1 MG PO TABS
ORAL_TABLET | ORAL | Status: AC
  Filled 2013-04-27: qty 1

## 2013-04-27 MED ORDER — CLONIDINE HCL 0.1 MG PO TABS
0.1000 mg | ORAL_TABLET | Freq: Once | ORAL | Status: AC
  Administered 2013-04-27: 0.1 mg via ORAL

## 2013-04-27 NOTE — Discharge Instructions (Signed)
Blood pressure over the ideal can put you at higher risk for stroke, heart disease, and kidney failure.  For this reason, it's important to try to get your blood pressure as close as possible to the ideal. ° °The ideal blood pressure is 120/80.  Blood pressures from 120-139 systolic over 80-89 diastolic are labeled as "prehypertension."  This means you are at higher risk of developing hypertension in the future.  Blood pressures in this range are not treated with medication, but lifestyle changes are recommended to prevent progression to hypertension.  Blood pressures of 140 and above systolic over 90 and above diastolic are classified as hypertension and are treated with medications. ° °Lifestyle changes which can benefit both prehypertension and hypertension include the following: ° °· Salt and sodium restriction. °· Weight loss. °· Regular exercise. °· Avoidance of tobacco. °· Avoidance of excess alcohol. °· The "D.A.S.H" diet. ° °· People with hypertension and prehypertension should limit their salt intake to less than 1500 mg daily.  Reading the nutrition information on the label of many prepared foods can give you an idea of how much sodium you're consuming at each meal.  Remember that the most important number on the nutrition information is the serving size.  It may be smaller than you think.  Try to avoid adding extra salt at the table.  You may add small amounts of salt while cooking.  Remember that salt is an acquired taste and you may get used to a using a whole lot less salt than you are using now.  Using less salt lets the food's natural flavors come through.  You might want to consider using salt substitutes, potassium chloride, pepper, or blends of herbs and spices to enhance the flavor of your food.  Foods that contain the most salt include: processed meats (like ham, bacon, lunch meat, sausage, hot dogs, and breakfast meat), chips, pretzels, salted nuts, soups, salty snacks, canned foods, junk  food, fast food, restaurant food, mustard, pickles, pizza, popcorn, soy sauce, and worcestershire sauce--quite a list!  You might ask, "Is there anything I can eat?"  The answer is, "yes."  Fruits and vegetables are usually low in salt.  Fresh is better than frozen which is better than canned.  If you have canned vegetables, you can cut down on the salt content by rinsing them in tap water 3 times before cooking.   ° °· Weight loss is the second thing you can do to lower your blood pressure.  Getting to and maintaining ideal weight will often normalize your blood pressure and allow you to avoid medications, entirely, cut way down on your dosage of medications, or allow to wean off your meds.  (Note, this should only be done under the supervision of your primary care doctor.)  Of course, weight loss takes time and you may need to be on medication in the meantime.  You shoot for a body mass index of 20-25.  When you go to the urgent care or to your primary care doctor, they should calculate your BMI.  If you don't know what it is, ask.  You can calculate your BMI with the following formula:  Weight in pounds x 703/ (height in inches) x (height in inches).  There are many good diets out there: Weight Watchers and the D.A.S.H. Diet are the best, but often, just modifying a few factors can be helpful:  Don't skip meals, don't eat out, and keeping a food diary.  I do not recommend   fad diets or diet pills which often raise blood pressure.  ° °· Everyone should get regular exercise, but this is particularly important for people with high blood pressure.  Just about any exercise is good.  The only exercise which may be harmful is lifting extreme heavy weights.  I recommend moderate exercise such as walking for 30 minutes 5 days a week.  Going to the gym for a 50 minute workout 3 times a week is also good.  This amounts to 150 minutes of exercise weekly. ° °· Anyone with high blood pressure should avoid any use of tobacco.   Tobacco use does not elevate blood pressure, but it increases the risk of heart disease and stroke.  If you are interested in quitting, discuss with your doctor how to quit.  If you are not interested in quitting, ask yourself, "What would my life be like in 10 years if I continue to smoke?"  "How will I know when it is time to quit?"  "How would my life be better if I were to quit." ° °· Excess alcohol intake can raise the blood pressure.  The safe alcohol intake is 2 drinks or less per day for men and 1 drink per day or less for women. ° °· There is a very good diet which I recommend that has been designed for people with blood pressure called the D.A.S.H. Diet (dietary approaches to stop hypertension).  It consists of fruits, vegetables, lean meats, low fat dairy, whole grains, nuts and seeds.  It is very low in salt and sodium.  It has also been found to have other beneficial health effects such as lowering cholesterol and helping lose weight.  It has been developed by the National Institutes of Health and can be downloaded from the internet without any cost. Just do a web search on "D.A.S.H. Diet." or go the NIH website (www.nih.gov).  There are also cookbooks and diet plans that can be gotten from [employer] to help you with this diet. °·  °

## 2013-04-27 NOTE — ED Provider Notes (Signed)
Chief Complaint   Chief Complaint  Patient presents with  . Hypertension    History of Present Illness   Melissa Warren is a 80 year old female who was diagnosed with hypertension a month ago. She was placed on losartan/HCTZ 100/25 once daily. She took this for about 2 weeks. Then her blood pressure began to go down, so she stopped the medication altogether about 7 days ago. Today she happened to check her blood pressure and was very high at a systolic over 200. She took her medication later on in the morning and then one in the afternoon, so she took 2 pills today. Her blood pressure still high. She states she has no symptoms right now. She denies any headaches, dizziness, lightheadedness, blurry vision, shortness of breath, chest pain, tightness, pressure, palpitations, syncope, ankle edema, or strokelike symptoms. She had blood work done about 2 weeks ago. This all turned out normal. She has no history of kidney disease, diabetes, elevated cholesterol, or cigarette smoking.  Review of Systems   Other than as noted above, the patient denies any of the following symptoms: Respiratory:  No coughing, wheezing, or shortness of breath. Cardiac:  No chest pain, tightness, pressure, palpitations, syncope, or edema. Neuro:  No headache, dizziness, blurred vision, weakness, paresthesias, or strokelike symptoms.   PMFSH   Past medical history, family history, social history, meds, and allergies were reviewed.  She has a history of glaucoma and high blood pressure. Her only other medications are some eyedrops.  Physical Examination   Vital signs:  BP 195/95  Pulse 60  Temp(Src) 98.3 F (36.8 C) (Oral)  Resp 16  SpO2 97% General:  Alert, oriented, in no distress. Lungs:  Breath sounds clear and equal bilaterally.  No wheezes, rales, or rhonchi. Heart:  Regular rhythm, no gallops, murmers, clicks or rubs.  Abdomen:  Soft and flat.  Nontender, no organomegaly or mass.  No pulsatile midline  abdominal mass or bruit. Ext:  No edema, pulses full. Neurological exam:  Alert and oriented.  Speech is clear.  No pronator drift.  CNs intact.  Labs   Results for orders placed during the hospital encounter of 04/27/13  POCT I-STAT, CHEM 8      Result Value Ref Range   Sodium 143  137 - 147 mEq/L   Potassium 3.9  3.7 - 5.3 mEq/L   Chloride 103  96 - 112 mEq/L   BUN 11  6 - 23 mg/dL   Creatinine, Ser 1.61  0.50 - 1.10 mg/dL   Glucose, Bld 096 (*) 70 - 99 mg/dL   Calcium, Ion 0.45  4.09 - 1.30 mmol/L   TCO2 27  0 - 100 mmol/L   Hemoglobin 14.6  12.0 - 15.0 g/dL   HCT 81.1  91.4 - 78.2 %     Course in Urgent Care Center   She was given clonidine 0.1 mg by mouth and observed. Her blood pressure came down somewhat, although not to normal. She stated she felt fine and had no symptoms.  Assessment   The encounter diagnosis was Hypertension.  Plan   1.  Meds:  The following meds were prescribed:   New Prescriptions   No medications on file    2.  Patient Education/Counseling:  The patient was given appropriate handouts, self care instructions, and instructed in symptomatic relief. Specifically discussed salt and sodium restriction, weight control, and exercise. She'll resume her blood pressure medication as before and followup with Dr. Felicity Coyer in one week.  3.  Follow up:  The patient was told to follow up here if no better in 3 to 4 days, or sooner if becoming worse in any way, and given some red flag symptoms such as severe headache, vision changes, shortness of breath, chest pain or stroke like symptoms which would prompt immediate return.      Reuben Likesavid C Osinachi Navarrette, MD 04/27/13 [phone]

## 2013-04-27 NOTE — ED Notes (Signed)
Here for evaluation of hypertension.

## 2013-05-06 ENCOUNTER — Encounter: Payer: Self-pay | Admitting: Internal Medicine

## 2013-05-06 ENCOUNTER — Ambulatory Visit (INDEPENDENT_AMBULATORY_CARE_PROVIDER_SITE_OTHER): Payer: Medicare Other | Admitting: Internal Medicine

## 2013-05-06 VITALS — BP 128/78 | HR 71 | Temp 97.9°F | Wt 160.0 lb

## 2013-05-06 DIAGNOSIS — I10 Essential (primary) hypertension: Secondary | ICD-10-CM

## 2013-05-06 MED ORDER — VITAMIN C 500 MG PO TABS: 500.0000 mg | ORAL_TABLET | Freq: Two times a day (BID) | ORAL | Status: AC

## 2013-05-06 MED ORDER — THERA VITAL M PO TABS: 1.0000 | ORAL_TABLET | Freq: Every day | ORAL | Status: AC

## 2013-05-06 MED ORDER — VITAMIN D 1000 UNITS PO TABS: 1000.0000 [IU] | ORAL_TABLET | Freq: Every day | ORAL | Status: AC

## 2013-05-06 NOTE — Assessment & Plan Note (Signed)
BP Readings from Last 3 Encounters:  05/06/13 128/78  04/27/13 195/95  03/31/13 152/90   started ARB+HCT qd 03/2013 Improved overall - encouraged compliance Pt to monitor at home and call if SBP>140/90 or <100/60 for advice on med changes

## 2013-05-06 NOTE — Progress Notes (Signed)
Pre visit review using our clinic review tool, if applicable. No additional management support is needed unless otherwise documented below in the visit note. 

## 2013-05-06 NOTE — Patient Instructions (Addendum)
It was good to see you today.  We have reviewed your prior records including labs and tests today  Medications reviewed and updated, no changes recommended at this time.  Blood Pressure Goal= AVERAGE < 140/90; Ideal is an AVERAGE < 135/85. This AVERAGE should be calculated from @ least 5-7 BP readings taken @ different times of day on different days of week. You should not respond to isolated BP readings , but rather the AVERAGE for that week  Call my office of BP over 160/90 or under 100/60 for advice on medication changes if/as needed  Please schedule followup in 6 months, call sooner if problems.  Managing Your High Blood Pressure Blood pressure is a measurement of how forceful your blood is pressing against the walls of the arteries. Arteries are muscular tubes within the circulatory system. Blood pressure does not stay the same. Blood pressure rises when you are active, excited, or nervous; and it lowers during sleep and relaxation. If the numbers measuring your blood pressure stay above normal most of the time, you are at risk for health problems. High blood pressure (hypertension) is a long-term (chronic) condition in which blood pressure is elevated. A blood pressure reading is recorded as two numbers, such as 120 over 80 (or 120/80). The first, higher number is called the systolic pressure. It is a measure of the pressure in your arteries as the heart beats. The second, lower number is called the diastolic pressure. It is a measure of the pressure in your arteries as the heart relaxes between beats.  Keeping your blood pressure in a normal range is important to your overall health and prevention of health problems, such as heart disease and stroke. When your blood pressure is uncontrolled, your heart has to work harder than normal. High blood pressure is a very common condition in adults because blood pressure tends to rise with age. Men and women are equally likely to have hypertension but  at different times in life. Before age 45, men are more likely to have hypertension. After 78 years of age, women are more likely to have it. Hypertension is especially common in African Americans. This condition often has no signs or symptoms. The cause of the condition is usually not known. Your caregiver can help you come up with a plan to keep your blood pressure in a normal, healthy range. BLOOD PRESSURE STAGES Blood pressure is classified into four stages: normal, prehypertension, stage 1, and stage 2. Your blood pressure reading will be used to determine what type of treatment, if any, is necessary. Appropriate treatment options are tied to these four stages:  Normal  Systolic pressure (mm Hg): below 120.  Diastolic pressure (mm Hg): below 80. Prehypertension  Systolic pressure (mm Hg): 120 to 139.  Diastolic pressure (mm Hg): 80 to 89. Stage1  Systolic pressure (mm Hg): 140 to 159.  Diastolic pressure (mm Hg): 90 to 99. Stage2  Systolic pressure (mm Hg): 160 or above.  Diastolic pressure (mm Hg): 100 or above. RISKS RELATED TO HIGH BLOOD PRESSURE Managing your blood pressure is an important responsibility. Uncontrolled high blood pressure can lead to:  A heart attack.  A stroke.  A weakened blood vessel (aneurysm).  Heart failure.  Kidney damage.  Eye damage.  Metabolic syndrome.  Memory and concentration problems. HOW TO MANAGE YOUR BLOOD PRESSURE Blood pressure can be managed effectively with lifestyle changes and medicines (if needed). Your caregiver will help you come up with a plan to bring your blood  pressure within a normal range. Your plan should include the following: Education  Read all information provided by your caregivers about how to control blood pressure.  Educate yourself on the latest guidelines and treatment recommendations. New research is always being done to further define the risks and treatments for high blood  pressure. Lifestylechanges  Control your weight.  Avoid smoking.  Stay physically active.  Reduce the amount of salt in your diet.  Reduce stress.  Control any chronic conditions, such as high cholesterol or diabetes.  Reduce your alcohol intake. Medicines  Several medicines (antihypertensive medicines) are available, if needed, to bring blood pressure within a normal range. Communication  Review all the medicines you take with your caregiver because there may be side effects or interactions.  Talk with your caregiver about your diet, exercise habits, and other lifestyle factors that may be contributing to high blood pressure.  See your caregiver regularly. Your caregiver can help you create and adjust your plan for managing high blood pressure. RECOMMENDATIONS FOR TREATMENT AND FOLLOW-UP  The following recommendations are based on current guidelines for managing high blood pressure in nonpregnant adults. Use these recommendations to identify the proper follow-up period or treatment option based on your blood pressure reading. You can discuss these options with your caregiver.  Systolic pressure of 120 to 139 or diastolic pressure of 80 to 89: Follow up with your caregiver as directed.  Systolic pressure of 140 to 160 or diastolic pressure of 90 to 100: Follow up with your caregiver within 2 months.  Systolic pressure above 160 or diastolic pressure above 100: Follow up with your caregiver within 1 month.  Systolic pressure above 180 or diastolic pressure above 110: Consider antihypertensive therapy; follow up with your caregiver within 1 week.  Systolic pressure above 200 or diastolic pressure above 120: Begin antihypertensive therapy; follow up with your caregiver within 1 week. Document Released: 10/22/2011 Document Reviewed: 10/22/2011 [facility] Patient Information 2014 [place], [place].

## 2013-05-06 NOTE — Progress Notes (Signed)
   Subjective:    Patient ID: Melissa Warren, female    DOB: [birth year], 80 y.o.   MRN: [id number]  HPI  Patient is here for follow up HTN Reviewed chronic medical issues and interval medical events -[facility] visit 3/18 for uncontrolled BP (not taking med) -reviewed  Past Medical History  Diagnosis Date  . Glaucoma     [facility]  . Blood transfusion 1977    after hysterectomy  . Cataract   . Constipation   . Hypertension     Review of Systems  Constitutional: Negative for fatigue.  Respiratory: Negative for shortness of breath.   Cardiovascular: Negative for chest pain, palpitations and leg swelling.       Objective:   Physical Exam  BP 128/78  Pulse 71  Temp(Src) 97.9 F (36.6 C) (Oral)  Wt 160 lb (72.576 kg)  SpO2 98% Wt Readings from Last 3 Encounters:  05/06/13 160 lb (72.576 kg)  03/31/13 166 lb 9.6 oz (75.569 kg)  03/22/12 158 lb 1.9 oz (71.723 kg)   Constitutional: She appears well-developed and well-nourished. No distress.  Neck: Normal range of motion. Neck supple. No JVD present. No thyromegaly present.  Cardiovascular: Normal rate, regular rhythm and normal heart sounds.  No murmur heard. No BLE edema. Pulmonary/Chest: Effort normal and breath sounds normal. No respiratory distress. She has no wheezes.  Psychiatric: She has a normal mood and affect. Her behavior is normal. Judgment and thought content normal.   Lab Results  Component Value Date   WBC 6.7 03/31/2013   HGB 14.6 04/27/2013   HCT 43.0 04/27/2013   PLT 271.0 03/31/2013   GLUCOSE 109* 04/27/2013   CHOL 176 03/31/2013   TRIG 84.0 03/31/2013   HDL 52.50 03/31/2013   LDLCALC 107* 03/31/2013   ALT 11 03/22/2012   AST 19 03/22/2012   NA 143 04/27/2013   K 3.9 04/27/2013   CL 103 04/27/2013   CREATININE 1.00 04/27/2013   BUN 11 04/27/2013   CO2 27 03/31/2013   TSH 0.90 03/31/2013   HGBA1C 6.0 03/31/2013    No results found.     Assessment & Plan:   Problem List Items Addressed  This Visit   Hypertension - Primary      BP Readings from Last 3 Encounters:  05/06/13 128/78  04/27/13 195/95  03/31/13 152/90   started ARB+HCT qd 03/2013 Improved overall - encouraged compliance Pt to monitor at home and call if SBP>140/90 or <100/60 for advice on med changes

## 2013-05-07 ENCOUNTER — Telehealth: Payer: Self-pay | Admitting: Internal Medicine

## 2013-05-07 NOTE — Telephone Encounter (Signed)
Relevant patient education assigned to patient using Emmi. ° °

## 2013-05-30 ENCOUNTER — Encounter: Payer: Medicare Other | Admitting: Internal Medicine

## 2013-06-01 DIAGNOSIS — H4011X0 Primary open-angle glaucoma, stage unspecified: Secondary | ICD-10-CM | POA: Diagnosis not present

## 2013-06-01 DIAGNOSIS — Z961 Presence of intraocular lens: Secondary | ICD-10-CM | POA: Insufficient documentation

## 2013-06-01 DIAGNOSIS — H409 Unspecified glaucoma: Secondary | ICD-10-CM | POA: Diagnosis not present

## 2013-08-23 ENCOUNTER — Other Ambulatory Visit: Payer: Self-pay

## 2013-08-23 MED ORDER — LOSARTAN POTASSIUM-HCTZ 100-25 MG PO TABS: 1.0000 | ORAL_TABLET | Freq: Every day | ORAL | Status: DC

## 2013-08-29 ENCOUNTER — Other Ambulatory Visit: Payer: Self-pay

## 2013-08-29 MED ORDER — LOSARTAN POTASSIUM-HCTZ 100-25 MG PO TABS: 1.0000 | ORAL_TABLET | Freq: Every day | ORAL | Status: DC

## 2013-09-28 DIAGNOSIS — H409 Unspecified glaucoma: Secondary | ICD-10-CM | POA: Diagnosis not present

## 2013-09-28 DIAGNOSIS — H4011X0 Primary open-angle glaucoma, stage unspecified: Secondary | ICD-10-CM | POA: Diagnosis not present

## 2013-09-28 DIAGNOSIS — Z961 Presence of intraocular lens: Secondary | ICD-10-CM | POA: Diagnosis not present

## 2013-10-20 ENCOUNTER — Ambulatory Visit: Payer: Medicare Other | Admitting: Internal Medicine

## 2013-11-08 ENCOUNTER — Ambulatory Visit: Payer: Medicare Other | Admitting: Internal Medicine

## 2013-12-08 DIAGNOSIS — H4011X2 Primary open-angle glaucoma, moderate stage: Secondary | ICD-10-CM | POA: Diagnosis not present

## 2013-12-29 ENCOUNTER — Encounter: Payer: Self-pay | Admitting: Internal Medicine

## 2013-12-29 ENCOUNTER — Ambulatory Visit (INDEPENDENT_AMBULATORY_CARE_PROVIDER_SITE_OTHER): Payer: Medicare Other | Admitting: Internal Medicine

## 2013-12-29 VITALS — BP 118/68 | HR 59 | Temp 97.8°F | Ht 62.0 in | Wt 153.2 lb

## 2013-12-29 DIAGNOSIS — I10 Essential (primary) hypertension: Secondary | ICD-10-CM | POA: Diagnosis not present

## 2013-12-29 DIAGNOSIS — Z23 Encounter for immunization: Secondary | ICD-10-CM | POA: Diagnosis not present

## 2013-12-29 NOTE — Progress Notes (Signed)
Pre visit review using our clinic review tool, if applicable. No additional management support is needed unless otherwise documented below in the visit note. 

## 2013-12-29 NOTE — Progress Notes (Signed)
   Subjective:    Patient ID: Melissa Warren, female    DOB: 04-29-1933, 80 y.o.   MRN: [id number]  HPI  Patient is here for follow up  Reviewed chronic medical issues and interval medical events  Past Medical History  Diagnosis Date  . Glaucoma     [facility]  . Blood transfusion 1977    after hysterectomy  . Cataract   . Constipation   . Hypertension     Review of Systems  Constitutional: Negative for fever, fatigue and unexpected weight change (intentonal wt reduction with diet/exercise changes since 03/2013).  Respiratory: Negative for cough and shortness of breath.   Cardiovascular: Negative for chest pain and leg swelling.       Objective:   Physical Exam  BP 118/68 mmHg  Pulse 59  Temp(Src) 97.8 F (36.6 C) (Oral)  Ht 5\' 2"  (1.575 m)  Wt 153 lb 4 oz (69.514 kg)  BMI 28.02 kg/m2  SpO2 98% Wt Readings from Last 3 Encounters:  12/29/13 153 lb 4 oz (69.514 kg)  05/06/13 160 lb (72.576 kg)  03/31/13 166 lb 9.6 oz (75.569 kg)   Constitutional: She appears well-developed and well-nourished. No distress.  Neck: Normal range of motion. Neck supple. No JVD present. No thyromegaly present.  Cardiovascular: Normal rate, regular rhythm and normal heart sounds.  No murmur heard. No BLE edema. Pulmonary/Chest: Effort normal and breath sounds normal. No respiratory distress. She has no wheezes.  Psychiatric: She has a normal mood and affect. Her behavior is normal. Judgment and thought content normal.   Lab Results  Component Value Date   WBC 6.7 03/31/2013   HGB 14.6 04/27/2013   HCT 43.0 04/27/2013   PLT 271.0 03/31/2013   GLUCOSE 109* 04/27/2013   CHOL 176 03/31/2013   TRIG 84.0 03/31/2013   HDL 52.50 03/31/2013   LDLCALC 107* 03/31/2013   ALT 11 03/22/2012   AST 19 03/22/2012   NA 143 04/27/2013   K 3.9 04/27/2013   CL 103 04/27/2013   CREATININE 1.00 04/27/2013   BUN 11 04/27/2013   CO2 27 03/31/2013   TSH 0.90 03/31/2013   HGBA1C  6.0 03/31/2013    No results found.     Assessment & Plan:   Problem List Items Addressed This Visit    Hypertension - Primary    BP Readings from Last 3 Encounters:  12/29/13 118/68  05/06/13 128/78  04/27/13 195/95   started ARB+HCT qd 03/2013 for SBP>180s The current medical regimen is effective;  continue present plan and medications.  Pt to monitor at home and call if SBP>140/90 or <100/60 for advice on med changes

## 2013-12-29 NOTE — Patient Instructions (Addendum)
It was good to see you today.  We have reviewed your prior records including labs and tests today  Keep up the good work on exercise and weight reduction  Medications reviewed and updated, no changes recommended at this time.  Please schedule followup in 4 months for annual exam and labs, call sooner if problems.  Managing Your High Blood Pressure Blood pressure is a measurement of how forceful your blood is pressing against the walls of the arteries. Arteries are muscular tubes within the circulatory system. Blood pressure does not stay the same. Blood pressure rises when you are active, excited, or nervous; and it lowers during sleep and relaxation. If the numbers measuring your blood pressure stay above normal most of the time, you are at risk for health problems. High blood pressure (hypertension) is a long-term (chronic) condition in which blood pressure is elevated. A blood pressure reading is recorded as two numbers, such as 120 over 80 (or 120/80). The first, higher number is called the systolic pressure. It is a measure of the pressure in your arteries as the heart beats. The second, lower number is called the diastolic pressure. It is a measure of the pressure in your arteries as the heart relaxes between beats.  Keeping your blood pressure in a normal range is important to your overall health and prevention of health problems, such as heart disease and stroke. When your blood pressure is uncontrolled, your heart has to work harder than normal. High blood pressure is a very common condition in adults because blood pressure tends to rise with age. Men and women are equally likely to have hypertension but at different times in life. Before age 78, men are more likely to have hypertension. After 78 years of age, women are more likely to have it. Hypertension is especially common in African Americans. This condition often has no signs or symptoms. The cause of the condition is usually not known.  Your caregiver can help you come up with a plan to keep your blood pressure in a normal, healthy range. BLOOD PRESSURE STAGES Blood pressure is classified into four stages: normal, prehypertension, stage 1, and stage 2. Your blood pressure reading will be used to determine what type of treatment, if any, is necessary. Appropriate treatment options are tied to these four stages:  Normal  Systolic pressure (mm Hg): below 120.  Diastolic pressure (mm Hg): below 80. Prehypertension  Systolic pressure (mm Hg): 120 to 139.  Diastolic pressure (mm Hg): 80 to 89. Stage1  Systolic pressure (mm Hg): 140 to 159.  Diastolic pressure (mm Hg): 90 to 99. Stage2  Systolic pressure (mm Hg): 160 or above.  Diastolic pressure (mm Hg): 100 or above. RISKS RELATED TO HIGH BLOOD PRESSURE Managing your blood pressure is an important responsibility. Uncontrolled high blood pressure can lead to:  A heart attack.  A stroke.  A weakened blood vessel (aneurysm).  Heart failure.  Kidney damage.  Eye damage.  Metabolic syndrome.  Memory and concentration problems. HOW TO MANAGE YOUR BLOOD PRESSURE Blood pressure can be managed effectively with lifestyle changes and medicines (if needed). Your caregiver will help you come up with a plan to bring your blood pressure within a normal range. Your plan should include the following: Education  Read all information provided by your caregivers about how to control blood pressure.  Educate yourself on the latest guidelines and treatment recommendations. New research is always being done to further define the risks and treatments for high blood pressure.  Lifestylechanges  Control your weight.  Avoid smoking.  Stay physically active.  Reduce the amount of salt in your diet.  Reduce stress.  Control any chronic conditions, such as high cholesterol or diabetes.  Reduce your alcohol intake. Medicines  Several medicines (antihypertensive  medicines) are available, if needed, to bring blood pressure within a normal range. Communication  Review all the medicines you take with your caregiver because there may be side effects or interactions.  Talk with your caregiver about your diet, exercise habits, and other lifestyle factors that may be contributing to high blood pressure.  See your caregiver regularly. Your caregiver can help you create and adjust your plan for managing high blood pressure. RECOMMENDATIONS FOR TREATMENT AND FOLLOW-UP  The following recommendations are based on current guidelines for managing high blood pressure in nonpregnant adults. Use these recommendations to identify the proper follow-up period or treatment option based on your blood pressure reading. You can discuss these options with your caregiver.  Systolic pressure of 120 to 139 or diastolic pressure of 80 to 89: Follow up with your caregiver as directed.  Systolic pressure of 140 to 160 or diastolic pressure of 90 to 100: Follow up with your caregiver within 2 months.  Systolic pressure above 160 or diastolic pressure above 100: Follow up with your caregiver within 1 month.  Systolic pressure above 180 or diastolic pressure above 110: Consider antihypertensive therapy; follow up with your caregiver within 1 week.  Systolic pressure above 200 or diastolic pressure above 120: Begin antihypertensive therapy; follow up with your caregiver within 1 week. Document Released: 10/22/2011 Document Reviewed: 10/22/2011 [facility] Patient Information 2015 [place], [place]. This information is not intended to replace advice given to you by your health care provider. Make sure you discuss any questions you have with your health care provider.

## 2013-12-29 NOTE — Assessment & Plan Note (Signed)
BP Readings from Last 3 Encounters:  12/29/13 118/68  05/06/13 128/78  04/27/13 195/95   started ARB+HCT qd 03/2013 for SBP>180s The current medical regimen is effective;  continue present plan and medications.  Pt to monitor at home and call if SBP>140/90 or <100/60 for advice on med changes

## 2014-04-10 DIAGNOSIS — H4011X2 Primary open-angle glaucoma, moderate stage: Secondary | ICD-10-CM | POA: Diagnosis not present

## 2014-04-10 DIAGNOSIS — Z961 Presence of intraocular lens: Secondary | ICD-10-CM | POA: Diagnosis not present

## 2014-05-01 ENCOUNTER — Ambulatory Visit: Payer: Medicare Other | Admitting: Internal Medicine

## 2014-07-16 ENCOUNTER — Other Ambulatory Visit: Payer: Self-pay | Admitting: Internal Medicine

## 2014-08-02 DIAGNOSIS — H4011X2 Primary open-angle glaucoma, moderate stage: Secondary | ICD-10-CM | POA: Diagnosis not present

## 2014-08-02 DIAGNOSIS — Z961 Presence of intraocular lens: Secondary | ICD-10-CM | POA: Diagnosis not present

## 2014-09-06 ENCOUNTER — Ambulatory Visit (INDEPENDENT_AMBULATORY_CARE_PROVIDER_SITE_OTHER)
Admission: RE | Admit: 2014-09-06 | Discharge: 2014-09-06 | Disposition: A | Discharge status: Home / Self Care | Payer: Medicare Other | Source: Ambulatory Visit | Attending: Internal Medicine | Admitting: Internal Medicine

## 2014-09-06 ENCOUNTER — Encounter: Payer: Self-pay | Admitting: Internal Medicine

## 2014-09-06 ENCOUNTER — Ambulatory Visit (INDEPENDENT_AMBULATORY_CARE_PROVIDER_SITE_OTHER): Payer: Medicare Other | Admitting: Internal Medicine

## 2014-09-06 ENCOUNTER — Other Ambulatory Visit (INDEPENDENT_AMBULATORY_CARE_PROVIDER_SITE_OTHER): Payer: Medicare Other

## 2014-09-06 VITALS — BP 114/70 | HR 56 | Temp 97.9°F | Ht 62.0 in | Wt 152.8 lb

## 2014-09-06 DIAGNOSIS — Z0000 Encounter for general adult medical examination without abnormal findings: Secondary | ICD-10-CM | POA: Diagnosis not present

## 2014-09-06 DIAGNOSIS — R739 Hyperglycemia, unspecified: Secondary | ICD-10-CM

## 2014-09-06 DIAGNOSIS — I10 Essential (primary) hypertension: Secondary | ICD-10-CM

## 2014-09-06 DIAGNOSIS — M810 Age-related osteoporosis without current pathological fracture: Secondary | ICD-10-CM

## 2014-09-06 LAB — BASIC METABOLIC PANEL
BUN: 23 mg/dL (ref 6–23)
CALCIUM: 10 mg/dL (ref 8.4–10.5)
CHLORIDE: 103 meq/L (ref 96–112)
CO2: 33 mEq/L — ABNORMAL HIGH (ref 19–32)
Creatinine, Ser: 1.07 mg/dL (ref 0.40–1.20)
GFR: 63.55 mL/min (ref >60.00)
Glucose, Bld: 107 mg/dL — ABNORMAL HIGH (ref 70–99)
Potassium: 3.8 mEq/L (ref 3.5–5.1)
Sodium: 140 mEq/L (ref 135–145)

## 2014-09-06 LAB — LIPID PANEL
CHOL/HDL RATIO: 3
Cholesterol: 172 mg/dL (ref 0–200)
HDL: 49.2 mg/dL (ref >39.00)
LDL Cholesterol: 97 mg/dL (ref 0–99)
NONHDL: 122.8
Triglycerides: 131 mg/dL (ref 0.0–149.0)
VLDL: 26.2 mg/dL (ref 0.0–40.0)

## 2014-09-06 LAB — HEMOGLOBIN A1C: HEMOGLOBIN A1C: 5.6 % (ref 4.6–6.5)

## 2014-09-06 NOTE — Assessment & Plan Note (Signed)
Strong FH same Check a1c The patient is asked to make an attempt to improve diet and exercise patterns to aid in medical management of this problem.  Lab Results  Component Value Date   HGBA1C 6.0 03/31/2013

## 2014-09-06 NOTE — Patient Instructions (Addendum)
It was good to see you today.  We have reviewed your prior records including labs and tests today  Health Maintenance reviewed - all recommended immunizations and age-appropriate screenings are up-to-date.  Test(s) ordered today. Your results will be released to [place] (or called to you) after review, usually within 72hours after test completion. If any changes need to be made, you will be notified at that same time.  Medications reviewed and updated, no changes recommended at this time.  Please schedule followup in 12 months for annual exam and labs, call sooner if problems.  Health Maintenance Adopting a healthy lifestyle and getting preventive care can go a long way to promote health and wellness. Talk with your health care provider about what schedule of regular examinations is right for you. This is a good chance for you to check in with your provider about disease prevention and staying healthy. In between checkups, there are plenty of things you can do on your own. Experts have done a lot of research about which lifestyle changes and preventive measures are most likely to keep you healthy. Ask your health care provider for more information. WEIGHT AND DIET  Eat a healthy diet  Be sure to include plenty of vegetables, fruits, low-fat dairy products, and lean protein.  Do not eat a lot of foods high in solid fats, added sugars, or salt.  Get regular exercise. This is one of the most important things you can do for your health.  Most adults should exercise for at least 150 minutes each week. The exercise should increase your heart rate and make you sweat (moderate-intensity exercise).  Most adults should also do strengthening exercises at least twice a week. This is in addition to the moderate-intensity exercise.  Maintain a healthy weight  Body mass index (BMI) is a measurement that can be used to identify possible weight problems. It estimates body fat based on height and weight.  Your health care provider can help determine your BMI and help you achieve or maintain a healthy weight.  For females 20 years of age and older:   A BMI below 18.5 is considered underweight.  A BMI of 18.5 to 24.9 is normal.  A BMI of 25 to 29.9 is considered overweight.  A BMI of 30 and above is considered obese.  Watch levels of cholesterol and blood lipids  You should start having your blood tested for lipids and cholesterol at 79 years of age, then have this test every 5 years.  You may need to have your cholesterol levels checked more often if:  Your lipid or cholesterol levels are high.  You are older than 79 years of age.  You are at high risk for heart disease.  CANCER SCREENING   Lung Cancer  Lung cancer screening is recommended for adults 1-59 years old who are at high risk for lung cancer because of a history of smoking.  A yearly low-dose CT scan of the lungs is recommended for people who:  Currently smoke.  Have quit within the past 15 years.  Have at least a 30-pack-year history of smoking. A pack year is smoking an average of one pack of cigarettes a day for 1 year.  Yearly screening should continue until it has been 15 years since you quit.  Yearly screening should stop if you develop a health problem that would prevent you from having lung cancer treatment.  Breast Cancer  Practice breast self-awareness. This means understanding how your breasts normally appear and  feel.  It also means doing regular breast self-exams. Let your health care provider know about any changes, no matter how small.  If you are in your 20s or 30s, you should have a clinical breast exam (CBE) by a health care provider every 1-3 years as part of a regular health exam.  If you are 40 or older, have a CBE every year. Also consider having a breast X-ray (mammogram) every year.  If you have a family history of breast cancer, talk to your health care provider about genetic  screening.  If you are at high risk for breast cancer, talk to your health care provider about having an MRI and a mammogram every year.  Breast cancer gene (BRCA) assessment is recommended for women who have family members with BRCA-related cancers. BRCA-related cancers include:  Breast.  Ovarian.  Tubal.  Peritoneal cancers.  Results of the assessment will determine the need for genetic counseling and BRCA1 and BRCA2 testing. Cervical Cancer Routine pelvic examinations to screen for cervical cancer are no longer recommended for nonpregnant women who are considered low risk for cancer of the pelvic organs (ovaries, uterus, and vagina) and who do not have symptoms. A pelvic examination may be necessary if you have symptoms including those associated with pelvic infections. Ask your health care provider if a screening pelvic exam is right for you.   The Pap test is the screening test for cervical cancer for women who are considered at risk.  If you had a hysterectomy for a problem that was not cancer or a condition that could lead to cancer, then you no longer need Pap tests.  If you are older than 65 years, and you have had normal Pap tests for the past 10 years, you no longer need to have Pap tests.  If you have had past treatment for cervical cancer or a condition that could lead to cancer, you need Pap tests and screening for cancer for at least 20 years after your treatment.  If you no longer get a Pap test, assess your risk factors if they change (such as having a new sexual partner). This can affect whether you should start being screened again.  Some women have medical problems that increase their chance of getting cervical cancer. If this is the case for you, your health care provider may recommend more frequent screening and Pap tests.  The human papillomavirus (HPV) test is another test that may be used for cervical cancer screening. The HPV test looks for the virus that can  cause cell changes in the cervix. The cells collected during the Pap test can be tested for HPV.  The HPV test can be used to screen women 30 years of age and older. Getting tested for HPV can extend the interval between normal Pap tests from three to five years.  An HPV test also should be used to screen women of any age who have unclear Pap test results.  After 79 years of age, women should have HPV testing as often as Pap tests.  Colorectal Cancer  This type of cancer can be detected and often prevented.  Routine colorectal cancer screening usually begins at 79 years of age and continues through 79 years of age.  Your health care provider may recommend screening at an earlier age if you have risk factors for colon cancer.  Your health care provider may also recommend using home test kits to check for hidden blood in the stool.  A small camera   at the end of a tube can be used to examine your colon directly (sigmoidoscopy or colonoscopy). This is done to check for the earliest forms of colorectal cancer.  Routine screening usually begins at age 50.  Direct examination of the colon should be repeated every 5-10 years through 79 years of age. However, you may need to be screened more often if early forms of precancerous polyps or small growths are found. Skin Cancer  Check your skin from head to toe regularly.  Tell your health care provider about any new moles or changes in moles, especially if there is a change in a mole's shape or color.  Also tell your health care provider if you have a mole that is larger than the size of a pencil eraser.  Always use sunscreen. Apply sunscreen liberally and repeatedly throughout the day.  Protect yourself by wearing long sleeves, pants, a wide-brimmed hat, and sunglasses whenever you are outside. HEART DISEASE, DIABETES, AND HIGH BLOOD PRESSURE   Have your blood pressure checked at least every 1-2 years. High blood pressure causes heart  disease and increases the risk of stroke.  If you are between 55 years and 79 years old, ask your health care provider if you should take aspirin to prevent strokes.  Have regular diabetes screenings. This involves taking a blood sample to check your fasting blood sugar level.  If you are at a normal weight and have a low risk for diabetes, have this test once every three years after 79 years of age.  If you are overweight and have a high risk for diabetes, consider being tested at a younger age or more often. PREVENTING INFECTION  Hepatitis B  If you have a higher risk for hepatitis B, you should be screened for this virus. You are considered at high risk for hepatitis B if:  You were born in a country where hepatitis B is common. Ask your health care provider which countries are considered high risk.  Your parents were born in a high-risk country, and you have not been immunized against hepatitis B (hepatitis B vaccine).  You have HIV or AIDS.  You use needles to inject street drugs.  You live with someone who has hepatitis B.  You have had sex with someone who has hepatitis B.  You get hemodialysis treatment.  You take certain medicines for conditions, including cancer, organ transplantation, and autoimmune conditions. Hepatitis C  Blood testing is recommended for:  Everyone born from 1945 through 1965.  Anyone with known risk factors for hepatitis C. Sexually transmitted infections (STIs)  You should be screened for sexually transmitted infections (STIs) including gonorrhea and chlamydia if:  You are sexually active and are younger than 79 years of age.  You are older than 79 years of age and your health care provider tells you that you are at risk for this type of infection.  Your sexual activity has changed since you were last screened and you are at an increased risk for chlamydia or gonorrhea. Ask your health care provider if you are at risk.  If you do not have  HIV, but are at risk, it may be recommended that you take a prescription medicine daily to prevent HIV infection. This is called pre-exposure prophylaxis (PrEP). You are considered at risk if:  You are sexually active and do not regularly use condoms or know the HIV status of your partner(s).  You take drugs by injection.  You are sexually active with a partner   who has HIV. Talk with your health care provider about whether you are at high risk of being infected with HIV. If you choose to begin PrEP, you should first be tested for HIV. You should then be tested every 3 months for as long as you are taking PrEP.  PREGNANCY   If you are premenopausal and you may become pregnant, ask your health care provider about preconception counseling.  If you may become pregnant, take 400 to 800 micrograms (mcg) of folic acid every day.  If you want to prevent pregnancy, talk to your health care provider about birth control (contraception). OSTEOPOROSIS AND MENOPAUSE   Osteoporosis is a disease in which the bones lose minerals and strength with aging. This can result in serious bone fractures. Your risk for osteoporosis can be identified using a bone density scan.  If you are 65 years of age or older, or if you are at risk for osteoporosis and fractures, ask your health care provider if you should be screened.  Ask your health care provider whether you should take a calcium or vitamin D supplement to lower your risk for osteoporosis.  Menopause may have certain physical symptoms and risks.  Hormone replacement therapy may reduce some of these symptoms and risks. Talk to your health care provider about whether hormone replacement therapy is right for you.  HOME CARE INSTRUCTIONS   Schedule regular health, dental, and eye exams.  Stay current with your immunizations.   Do not use any tobacco products including cigarettes, chewing tobacco, or electronic cigarettes.  If you are pregnant, do not  drink alcohol.  If you are breastfeeding, limit how much and how often you drink alcohol.  Limit alcohol intake to no more than 1 drink per day for nonpregnant women. One drink equals 12 ounces of beer, 5 ounces of wine, or 1 ounces of hard liquor.  Do not use street drugs.  Do not share needles.  Ask your health care provider for help if you need support or information about quitting drugs.  Tell your health care provider if you often feel depressed.  Tell your health care provider if you have ever been abused or do not feel safe at home. Document Released: 08/12/2010 Document Revised: 06/13/2013 Document Reviewed: 12/29/2012 [employer] Patient Information 2015 [employer]. This information is not intended to replace advice given to you by your health care provider. Make sure you discuss any questions you have with your health care provider.  

## 2014-09-06 NOTE — Progress Notes (Signed)
Subjective:    Patient ID: Melissa Warren, female    DOB: 1933/09/19, 81 y.o.   MRN: [id number]  HPI   Here for medicare wellness  Diet: heart healthy Physical activity: sedentary Depression/mood screen: negative Hearing: intact to whispered voice Visual acuity: grossly normal, performs eye exam  ADLs: capable Fall risk: none Home safety: good Cognitive evaluation: intact to orientation, naming, recall and repetition EOL planning: adv directives, full code/ I agree  I have personally reviewed and have noted 1. The patient's medical and social history 2. Their use of alcohol, tobacco or illicit drugs 3. Their current medications and supplements 4. The patient's functional ability including ADL's, fall risks, home safety risks and hearing or visual impairment. 5. Diet and physical activities 6. Evidence for depression or mood disorders  Also reviewed chronic medical issues, interval events and current concerns   Past Medical History  Diagnosis Date  . Glaucoma     surgery "implant" B in 2014 at [facility]  . Blood transfusion 1977    after hysterectomy  . Cataract   . Constipation   . Hypertension    Family History  Problem Relation Age of Onset  . Colon cancer Sister 58  . Prostate cancer Paternal Grandfather   . Hypertension Maternal Grandmother   . Diabetes Maternal Grandmother   . Diabetes Mother   . Diabetes Brother   . Diabetes Brother   . Diabetes Brother   . Diabetes Sister   . Diabetes Sister   . Diabetes Sister   . Hypertension Sister   . Hypertension Brother   . Stroke Sister   . Kidney failure     History  Substance Use Topics  . Smoking status: Former Smoker    Quit date: 02/11/1975  . Smokeless tobacco: Never Used  . Alcohol Use: No    Review of Systems  Constitutional: Negative for fatigue and unexpected weight change.  Respiratory: Negative for cough, shortness of breath and wheezing.   Cardiovascular: Negative for chest pain, palpitations  and leg swelling.  Gastrointestinal: Negative for nausea, abdominal pain and diarrhea.  Neurological: Negative for dizziness, weakness, light-headedness and headaches.  Psychiatric/Behavioral: Negative for dysphoric mood. The patient is not nervous/anxious.   All other systems reviewed and are negative.  Patient Care Team: Newt Lukes, MD as PCP - General (Internal Medicine) Mardella Layman, MD as Attending Physician (Gastroenterology) Harrietta Guardian Moya (Ophthalmology)     Objective:    Physical Exam  Constitutional: She appears well-developed and well-nourished. No distress.  Cardiovascular: Normal rate, regular rhythm and normal heart sounds.   No murmur heard. Pulmonary/Chest: Effort normal and breath sounds normal. No respiratory distress.  Musculoskeletal: She exhibits no edema.    BP 114/70 mmHg  Pulse 56  Temp(Src) 97.9 F (36.6 C) (Oral)  Ht  (1.575 m)  Wt 152 lb 12 oz (69.287 kg)  BMI 27.93 kg/m2  SpO2 98% Wt Readings from Last 3 Encounters:  09/06/14 152 lb 12 oz (69.287 kg)  12/29/13 153 lb 4 oz (69.514 kg)  05/06/13 160 lb (72.576 kg)    Lab Results  Component Value Date   WBC 6.7 03/31/2013   HGB 14.6 04/27/2013   HCT 43.0 04/27/2013   PLT 271.0 03/31/2013   GLUCOSE 109* 04/27/2013   CHOL 176 03/31/2013   TRIG 84.0 03/31/2013   HDL 52.50 03/31/2013   LDLCALC 107* 03/31/2013   ALT 11 03/22/2012   AST 19 03/22/2012   NA 143 04/27/2013  K 3.9 04/27/2013   CL 103 04/27/2013   CREATININE 1.00 04/27/2013   BUN 11 04/27/2013   CO2 27 03/31/2013   TSH 0.90 03/31/2013   HGBA1C 6.0 03/31/2013    No results found.     Assessment & Plan:   [id number]/z00.00 - Today patient counseled on age appropriate routine health concerns for screening and prevention, each reviewed and up to date or declined. Immunizations reviewed and up to date or declined. Labs ordered and reviewed. Risk factors for depression reviewed and negative. Hearing function and  visual acuity are intact. ADLs screened and addressed as needed. Functional ability and level of safety reviewed and appropriate. Education, counseling and referrals performed based on assessed risks today. Patient provided with a copy of personalized plan for preventive services.  Problem List Items Addressed This Visit    Hyperglycemia    Strong FH same Check a1c The patient is asked to make an attempt to improve diet and exercise patterns to aid in medical management of this problem.  Lab Results  Component Value Date   HGBA1C 6.0 03/31/2013        Relevant Orders   Hemoglobin A1c   Hypertension    BP Readings from Last 3 Encounters:  09/06/14 114/70  12/29/13 118/68  05/06/13 128/78   started ARB+HCT qd 03/2013 for SBP>180s The current medical regimen is effective;  continue present plan and medications.       Relevant Orders   Basic metabolic panel   Lipid panel    Other Visit Diagnoses    Routine general medical examination at a health care facility    -  Primary    Senile osteoporosis        Relevant Orders    DG Bone Density        Rene Paci, MD

## 2014-09-06 NOTE — Progress Notes (Signed)
Pre visit review using our clinic review tool, if applicable. No additional management support is needed unless otherwise documented below in the visit note. 

## 2014-09-06 NOTE — Assessment & Plan Note (Signed)
BP Readings from Last 3 Encounters:  09/06/14 114/70  12/29/13 118/68  05/06/13 128/78   started ARB+HCT qd 03/2013 for SBP>180s The current medical regimen is effective;  continue present plan and medications.

## 2014-12-13 DIAGNOSIS — Z961 Presence of intraocular lens: Secondary | ICD-10-CM | POA: Diagnosis not present

## 2014-12-13 DIAGNOSIS — H401132 Primary open-angle glaucoma, bilateral, moderate stage: Secondary | ICD-10-CM | POA: Diagnosis not present

## 2015-01-15 DIAGNOSIS — H401132 Primary open-angle glaucoma, bilateral, moderate stage: Secondary | ICD-10-CM | POA: Diagnosis not present

## 2015-01-15 DIAGNOSIS — Z961 Presence of intraocular lens: Secondary | ICD-10-CM | POA: Diagnosis not present

## 2015-02-16 DIAGNOSIS — Z961 Presence of intraocular lens: Secondary | ICD-10-CM | POA: Diagnosis not present

## 2015-02-16 DIAGNOSIS — H401132 Primary open-angle glaucoma, bilateral, moderate stage: Secondary | ICD-10-CM | POA: Diagnosis not present

## 2015-04-27 DIAGNOSIS — Z961 Presence of intraocular lens: Secondary | ICD-10-CM | POA: Diagnosis not present

## 2015-04-27 DIAGNOSIS — H401132 Primary open-angle glaucoma, bilateral, moderate stage: Secondary | ICD-10-CM | POA: Diagnosis not present

## 2015-07-30 DIAGNOSIS — Z961 Presence of intraocular lens: Secondary | ICD-10-CM | POA: Diagnosis not present

## 2015-07-30 DIAGNOSIS — H401132 Primary open-angle glaucoma, bilateral, moderate stage: Secondary | ICD-10-CM | POA: Diagnosis not present

## 2015-08-09 ENCOUNTER — Ambulatory Visit: Payer: Medicare Other | Admitting: Internal Medicine

## 2015-09-18 ENCOUNTER — Encounter: Payer: Self-pay | Admitting: Internal Medicine

## 2015-09-18 ENCOUNTER — Other Ambulatory Visit (INDEPENDENT_AMBULATORY_CARE_PROVIDER_SITE_OTHER): Payer: Medicare Other

## 2015-09-18 ENCOUNTER — Ambulatory Visit (INDEPENDENT_AMBULATORY_CARE_PROVIDER_SITE_OTHER): Payer: Medicare Other | Admitting: Internal Medicine

## 2015-09-18 VITALS — BP 138/70 | HR 67 | Temp 98.3°F | Resp 14 | Ht 62.0 in | Wt 154.0 lb

## 2015-09-18 DIAGNOSIS — E785 Hyperlipidemia, unspecified: Secondary | ICD-10-CM | POA: Diagnosis not present

## 2015-09-18 DIAGNOSIS — R7301 Impaired fasting glucose: Secondary | ICD-10-CM

## 2015-09-18 DIAGNOSIS — Z0000 Encounter for general adult medical examination without abnormal findings: Secondary | ICD-10-CM | POA: Insufficient documentation

## 2015-09-18 DIAGNOSIS — R739 Hyperglycemia, unspecified: Secondary | ICD-10-CM | POA: Diagnosis not present

## 2015-09-18 DIAGNOSIS — I10 Essential (primary) hypertension: Secondary | ICD-10-CM

## 2015-09-18 LAB — LIPID PANEL
Cholesterol: 160 mg/dL (ref 0–200)
HDL: 51.1 mg/dL (ref >39.00)
LDL Cholesterol: 95 mg/dL (ref 0–99)
NonHDL: 109.37
Total CHOL/HDL Ratio: 3
Triglycerides: 72 mg/dL (ref 0.0–149.0)
VLDL: 14.4 mg/dL (ref 0.0–40.0)

## 2015-09-18 LAB — CBC
HEMATOCRIT: 35.7 % — AB (ref 36.0–46.0)
HEMOGLOBIN: 12.3 g/dL (ref 12.0–15.0)
MCHC: 34.5 g/dL (ref 30.0–36.0)
MCV: 89.7 fl (ref 78.0–100.0)
Platelets: 266 10*3/uL (ref 150.0–400.0)
RBC: 3.99 Mil/uL (ref 3.87–5.11)
RDW: 12.4 % (ref 11.5–15.5)
WBC: 5.9 10*3/uL (ref 4.0–10.5)

## 2015-09-18 LAB — COMPREHENSIVE METABOLIC PANEL
ALBUMIN: 3.9 g/dL (ref 3.5–5.2)
ALK PHOS: 51 U/L (ref 39–117)
ALT: 12 U/L (ref 0–35)
AST: 16 U/L (ref 0–37)
BUN: 23 mg/dL (ref 6–23)
CO2: 30 mEq/L (ref 19–32)
CREATININE: 1.02 mg/dL (ref 0.40–1.20)
Calcium: 9.7 mg/dL (ref 8.4–10.5)
Chloride: 102 mEq/L (ref 96–112)
GFR: 66.98 mL/min (ref >60.00)
Glucose, Bld: 118 mg/dL — ABNORMAL HIGH (ref 70–99)
Potassium: 3.4 mEq/L — ABNORMAL LOW (ref 3.5–5.1)
Sodium: 140 mEq/L (ref 135–145)
Total Bilirubin: 0.5 mg/dL (ref 0.2–1.2)
Total Protein: 6.9 g/dL (ref 6.0–8.3)

## 2015-09-18 LAB — HEMOGLOBIN A1C: Hgb A1c MFr Bld: 5.9 % (ref 4.6–6.5)

## 2015-09-18 NOTE — Assessment & Plan Note (Signed)
Checking HgA1c today. Weight stable.

## 2015-09-18 NOTE — Progress Notes (Signed)
Pre visit review using our clinic review tool, if applicable. No additional management support is needed unless otherwise documented below in the visit note. 

## 2015-09-18 NOTE — Patient Instructions (Signed)
We are checking the blood work today and will call you back with the results.  Keep track of the blood pressure 1-2 times per week for the next couple of weeks.   The goal is for the top number to around below 140 and the bottom number to be below 90.   Health Maintenance, Female Adopting a healthy lifestyle and getting preventive care can go a long way to promote health and wellness. Talk with your health care provider about what schedule of regular examinations is right for you. This is a good chance for you to check in with your provider about disease prevention and staying healthy. In between checkups, there are plenty of things you can do on your own. Experts have done a lot of research about which lifestyle changes and preventive measures are most likely to keep you healthy. Ask your health care provider for more information. WEIGHT AND DIET  Eat a healthy diet  Be sure to include plenty of vegetables, fruits, low-fat dairy products, and lean protein.  Do not eat a lot of foods high in solid fats, added sugars, or salt.  Get regular exercise. This is one of the most important things you can do for your health.  Most adults should exercise for at least 150 minutes each week. The exercise should increase your heart rate and make you sweat (moderate-intensity exercise).  Most adults should also do strengthening exercises at least twice a week. This is in addition to the moderate-intensity exercise.  Maintain a healthy weight  Body mass index (BMI) is a measurement that can be used to identify possible weight problems. It estimates body fat based on height and weight. Your health care provider can help determine your BMI and help you achieve or maintain a healthy weight.  For females 80 years of age and older:   A BMI below 18.5 is considered underweight.  A BMI of 18.5 to 24.9 is normal.  A BMI of 25 to 29.9 is considered overweight.  A BMI of 30 and above is considered obese.   Watch levels of cholesterol and blood lipids  You should start having your blood tested for lipids and cholesterol at 80 years of age, then have this test every 5 years.  You may need to have your cholesterol levels checked more often if:  Your lipid or cholesterol levels are high.  You are older than 80 years of age.  You are at high risk for heart disease.  CANCER SCREENING   Lung Cancer  Lung cancer screening is recommended for adults 75-73 years old who are at high risk for lung cancer because of a history of smoking.  A yearly low-dose CT scan of the lungs is recommended for people who:  Currently smoke.  Have quit within the past 15 years.  Have at least a 30-pack-year history of smoking. A pack year is smoking an average of one pack of cigarettes a day for 1 year.  Yearly screening should continue until it has been 15 years since you quit.  Yearly screening should stop if you develop a health problem that would prevent you from having lung cancer treatment.  Breast Cancer  Practice breast self-awareness. This means understanding how your breasts normally appear and feel.  It also means doing regular breast self-exams. Let your health care provider know about any changes, no matter how small.  If you are in your 20s or 30s, you should have a clinical breast exam (CBE) by a health  care provider every 1-3 years as part of a regular health exam.  If you are 34 or older, have a CBE every year. Also consider having a breast X-ray (mammogram) every year.  If you have a family history of breast cancer, talk to your health care provider about genetic screening.  If you are at high risk for breast cancer, talk to your health care provider about having an MRI and a mammogram every year.  Breast cancer gene (BRCA) assessment is recommended for women who have family members with BRCA-related cancers. BRCA-related cancers  include:  Breast.  Ovarian.  Tubal.  Peritoneal cancers.  Results of the assessment will determine the need for genetic counseling and BRCA1 and BRCA2 testing. Cervical Cancer Your health care provider may recommend that you be screened regularly for cancer of the pelvic organs (ovaries, uterus, and vagina). This screening involves a pelvic examination, including checking for microscopic changes to the surface of your cervix (Pap test). You may be encouraged to have this screening done every 3 years, beginning at age 21.  For women ages 89-65, health care providers may recommend pelvic exams and Pap testing every 3 years, or they may recommend the Pap and pelvic exam, combined with testing for human papilloma virus (HPV), every 5 years. Some types of HPV increase your risk of cervical cancer. Testing for HPV may also be done on women of any age with unclear Pap test results.  Other health care providers may not recommend any screening for nonpregnant women who are considered low risk for pelvic cancer and who do not have symptoms. Ask your health care provider if a screening pelvic exam is right for you.  If you have had past treatment for cervical cancer or a condition that could lead to cancer, you need Pap tests and screening for cancer for at least 20 years after your treatment. If Pap tests have been discontinued, your risk factors (such as having a new sexual partner) need to be reassessed to determine if screening should resume. Some women have medical problems that increase the chance of getting cervical cancer. In these cases, your health care provider may recommend more frequent screening and Pap tests. Colorectal Cancer  This type of cancer can be detected and often prevented.  Routine colorectal cancer screening usually begins at 80 years of age and continues through 80 years of age.  Your health care provider may recommend screening at an earlier age if you have risk factors for  colon cancer.  Your health care provider may also recommend using home test kits to check for hidden blood in the stool.  A small camera at the end of a tube can be used to examine your colon directly (sigmoidoscopy or colonoscopy). This is done to check for the earliest forms of colorectal cancer.  Routine screening usually begins at age 36.  Direct examination of the colon should be repeated every 5-10 years through 80 years of age. However, you may need to be screened more often if early forms of precancerous polyps or small growths are found. Skin Cancer  Check your skin from head to toe regularly.  Tell your health care provider about any new moles or changes in moles, especially if there is a change in a mole's shape or color.  Also tell your health care provider if you have a mole that is larger than the size of a pencil eraser.  Always use sunscreen. Apply sunscreen liberally and repeatedly throughout the day.  Protect  yourself by wearing long sleeves, pants, a wide-brimmed hat, and sunglasses whenever you are outside. HEART DISEASE, DIABETES, AND HIGH BLOOD PRESSURE   High blood pressure causes heart disease and increases the risk of stroke. High blood pressure is more likely to develop in:  People who have blood pressure in the high end of the normal range (130-139/85-89 mm Hg).  People who are overweight or obese.  People who are African American.  If you are 54-65 years of age, have your blood pressure checked every 3-5 years. If you are 81 years of age or older, have your blood pressure checked every year. You should have your blood pressure measured twice--once when you are at a hospital or clinic, and once when you are not at a hospital or clinic. Record the average of the two measurements. To check your blood pressure when you are not at a hospital or clinic, you can use:  An automated blood pressure machine at a pharmacy.  A home blood pressure monitor.  If you  are between 81 years and 18 years old, ask your health care provider if you should take aspirin to prevent strokes.  Have regular diabetes screenings. This involves taking a blood sample to check your fasting blood sugar level.  If you are at a normal weight and have a low risk for diabetes, have this test once every three years after 80 years of age.  If you are overweight and have a high risk for diabetes, consider being tested at a younger age or more often. PREVENTING INFECTION  Hepatitis B  If you have a higher risk for hepatitis B, you should be screened for this virus. You are considered at high risk for hepatitis B if:  You were born in a country where hepatitis B is common. Ask your health care provider which countries are considered high risk.  Your parents were born in a high-risk country, and you have not been immunized against hepatitis B (hepatitis B vaccine).  You have HIV or AIDS.  You use needles to inject street drugs.  You live with someone who has hepatitis B.  You have had sex with someone who has hepatitis B.  You get hemodialysis treatment.  You take certain medicines for conditions, including cancer, organ transplantation, and autoimmune conditions. Hepatitis C  Blood testing is recommended for:  Everyone born from [birth year] through 1963.  Anyone with known risk factors for hepatitis C. Sexually transmitted infections (STIs)  You should be screened for sexually transmitted infections (STIs) including gonorrhea and chlamydia if:  You are sexually active and are younger than 80 years of age.  You are older than 80 years of age and your health care provider tells you that you are at risk for this type of infection.  Your sexual activity has changed since you were last screened and you are at an increased risk for chlamydia or gonorrhea. Ask your health care provider if you are at risk.  If you do not have HIV, but are at risk, it may be recommended that you  take a prescription medicine daily to prevent HIV infection. This is called pre-exposure prophylaxis (PrEP). You are considered at risk if:  You are sexually active and do not regularly use condoms or know the HIV status of your partner(s).  You take drugs by injection.  You are sexually active with a partner who has HIV. Talk with your health care provider about whether you are at high risk of being infected with  HIV. If you choose to begin PrEP, you should first be tested for HIV. You should then be tested every 3 months for as long as you are taking PrEP.  PREGNANCY   If you are premenopausal and you may become pregnant, ask your health care provider about preconception counseling.  If you may become pregnant, take 400 to 800 micrograms (mcg) of folic acid every day.  If you want to prevent pregnancy, talk to your health care provider about birth control (contraception). OSTEOPOROSIS AND MENOPAUSE   Osteoporosis is a disease in which the bones lose minerals and strength with aging. This can result in serious bone fractures. Your risk for osteoporosis can be identified using a bone density scan.  If you are 70 years of age or older, or if you are at risk for osteoporosis and fractures, ask your health care provider if you should be screened.  Ask your health care provider whether you should take a calcium or vitamin D supplement to lower your risk for osteoporosis.  Menopause may have certain physical symptoms and risks.  Hormone replacement therapy may reduce some of these symptoms and risks. Talk to your health care provider about whether hormone replacement therapy is right for you.  HOME CARE INSTRUCTIONS   Schedule regular health, dental, and eye exams.  Stay current with your immunizations.   Do not use any tobacco products including cigarettes, chewing tobacco, or electronic cigarettes.  If you are pregnant, do not drink alcohol.  If you are breastfeeding, limit how  much and how often you drink alcohol.  Limit alcohol intake to no more than 1 drink per day for nonpregnant women. One drink equals 12 ounces of beer, 5 ounces of wine, or 1 ounces of hard liquor.  Do not use street drugs.  Do not share needles.  Ask your health care provider for help if you need support or information about quitting drugs.  Tell your health care provider if you often feel depressed.  Tell your health care provider if you have ever been abused or do not feel safe at home.   This information is not intended to replace advice given to you by your health care provider. Make sure you discuss any questions you have with your health care provider.   Document Released: 08/12/2010 Document Revised: 02/17/2014 Document Reviewed: 12/29/2012 Elsevier Interactive Patient Education [employer].

## 2015-09-18 NOTE — Assessment & Plan Note (Signed)
Checking labs today, declines shingles vaccine. Other immunizations up to date and reminded about the yearly flu shot. Encouraged her to start an exercise program for herself by [facility]. Counseled on the need for sun safety and mole surveillance. Given screening recommendations.

## 2015-09-18 NOTE — Assessment & Plan Note (Signed)
BP mildly up today and she will check at home. Taking losartan/hctz and will continue. Checking CMP and adjust as needed.

## 2015-09-18 NOTE — Progress Notes (Signed)
   Subjective:    Patient ID: Melissa Warren, female    DOB: [birth year], 82 y.o.   MRN: [id number]  HPI Here for medicare wellness, no new complaints. Please see A/P for status and treatment of chronic medical problems. She is also here to transfer to this provider since hers has left the practice. She is not having new problems but needs follow up of her old problems.   Diet: heart healthy Physical activity: sedentary Depression/mood screen: negative Hearing: intact to whispered voice, mild loss Visual acuity: grossly normal with glaucoma, performs annual eye exam  ADLs: capable Fall risk: none Home safety: good Cognitive evaluation: intact to orientation, naming, recall and repetition EOL planning: adv directives discussed, in place  I have personally reviewed and have noted 1. The patient's medical and social history - reviewed today no changes 2. Their use of alcohol, tobacco or illicit drugs 3. Their current medications and supplements 4. The patient's functional ability including ADL's, fall risks, home safety risks and hearing or visual impairment. 5. Diet and physical activities 6. Evidence for depression or mood disorders 7. Care team reviewed and updated (available in snapshot)  Review of Systems  Constitutional: Negative for activity change, appetite change, fatigue, fever and unexpected weight change.  HENT: Negative.   Eyes: Negative.   Respiratory: Negative for cough, chest tightness and shortness of breath.   Cardiovascular: Negative for chest pain, palpitations and leg swelling.  Gastrointestinal: Negative for abdominal distention, abdominal pain, constipation, diarrhea and nausea.  Musculoskeletal: Positive for arthralgias. Negative for back pain, gait problem and myalgias.  Skin: Negative.   Neurological: Negative.   Psychiatric/Behavioral: Negative.       Objective:   Physical Exam  Constitutional: She is oriented to person, place, and time. She appears  well-developed and well-nourished.  HENT:  Head: Normocephalic and atraumatic.  Eyes: EOM are normal.  Neck: Normal range of motion.  Cardiovascular: Normal rate and regular rhythm.   Pulmonary/Chest: Effort normal and breath sounds normal. No respiratory distress. She has no wheezes. She has no rales.  Abdominal: Soft. Bowel sounds are normal. She exhibits no distension. There is no tenderness. There is no rebound.  Musculoskeletal: She exhibits no edema.  Neurological: She is alert and oriented to person, place, and time. Coordination normal.  Skin: Skin is warm and dry.  Psychiatric: She has a normal mood and affect.   Vitals:   09/18/15 1334  BP: (!) 150/84  Pulse: 67  Resp: 14  Temp: 98.3 F (36.8 C)  TempSrc: Oral  SpO2: 96%  Weight: 154 lb (69.9 kg)  Height: 5\' 2"  (1.575 m)      Assessment & Plan:

## 2015-10-10 ENCOUNTER — Other Ambulatory Visit: Payer: Self-pay | Admitting: Internal Medicine

## 2015-11-12 DIAGNOSIS — H401132 Primary open-angle glaucoma, bilateral, moderate stage: Secondary | ICD-10-CM | POA: Diagnosis not present

## 2015-11-12 DIAGNOSIS — Z961 Presence of intraocular lens: Secondary | ICD-10-CM | POA: Diagnosis not present

## 2016-02-29 DIAGNOSIS — H401132 Primary open-angle glaucoma, bilateral, moderate stage: Secondary | ICD-10-CM | POA: Diagnosis not present

## 2016-06-04 DIAGNOSIS — H401132 Primary open-angle glaucoma, bilateral, moderate stage: Secondary | ICD-10-CM | POA: Diagnosis not present

## 2016-06-04 DIAGNOSIS — Z961 Presence of intraocular lens: Secondary | ICD-10-CM | POA: Diagnosis not present

## 2016-07-03 DIAGNOSIS — H401132 Primary open-angle glaucoma, bilateral, moderate stage: Secondary | ICD-10-CM | POA: Diagnosis not present

## 2016-07-21 DIAGNOSIS — H401132 Primary open-angle glaucoma, bilateral, moderate stage: Secondary | ICD-10-CM | POA: Diagnosis not present

## 2016-07-21 DIAGNOSIS — Z961 Presence of intraocular lens: Secondary | ICD-10-CM | POA: Diagnosis not present

## 2016-10-04 ENCOUNTER — Other Ambulatory Visit: Payer: Self-pay | Admitting: Internal Medicine

## 2016-12-01 DIAGNOSIS — H401132 Primary open-angle glaucoma, bilateral, moderate stage: Secondary | ICD-10-CM | POA: Diagnosis not present

## 2016-12-01 DIAGNOSIS — Z961 Presence of intraocular lens: Secondary | ICD-10-CM | POA: Diagnosis not present

## 2016-12-17 NOTE — Progress Notes (Signed)
Subjective:   Melissa Warren is a 83 y.o. female who presents for Medicare Annual (Subsequent) preventive examination.  Review of Systems:  No ROS.  Medicare Wellness Visit. Additional risk factors are reflected in the social history.  Cardiac Risk Factors include: advanced age (>6955men, 21>65 women);dyslipidemia;hypertension Sleep patterns: feels rested on waking, gets up 1 times nightly to void and sleeps 6-7 hours nightly.    Home Safety/Smoke Alarms: Feels safe in home. Smoke alarms in place.  Living environment; residence and Firearm Safety: 2-story house, no firearms.Lives alone, no needs for DME, good support system Seat Belt Safety/Bike Helmet: Wears seat belt.    Objective:     Vitals: BP 124/72   Pulse 62   Temp 97.8 F (36.6 C)   Resp 20   Ht 5\' 2"  (1.575 m)   Wt 152 lb (68.9 kg)   SpO2 100%   BMI 27.80 kg/m   Body mass index is 27.8 kg/m.   Tobacco Social History   Tobacco Use  Smoking Status Former Smoker  . Last attempt to quit: 02/11/1975  . Years since quitting: 41.8  Smokeless Tobacco Never Used     Counseling given: Not Answered   Past Medical History:  Diagnosis Date  . Blood transfusion 1977   after hysterectomy  . Cataract   . Constipation   . Glaucoma    [facility]  . Hypertension    Past Surgical History:  Procedure Laterality Date  . ABDOMINAL HYSTERECTOMY  1977  . CATARACT EXTRACTION  2014 and 2015   [facility]  . CESAREAN SECTION  1965  . GLAUCOMA SURGERY  2010/2012   Laser bilaterally and micro shunt implant in right eye  . GLAUCOMA VALVE INSERTION  12/2011   dr frank moya, right eye   Family History  Problem Relation Age of Onset  . Colon cancer Sister 2884  . Prostate cancer Paternal Grandfather   . Hypertension Maternal Grandmother   . Diabetes Maternal Grandmother   . Diabetes Mother   . Diabetes Brother   . Diabetes Brother   . Diabetes Brother   . Diabetes Sister   . Diabetes Sister   . Diabetes  Sister   . Hypertension Sister   . Hypertension Brother   . Stroke Sister   . Kidney failure Unknown    Social History   Substance and Sexual Activity  Sexual Activity Not on file    Outpatient Encounter Medications as of 12/18/2016  Medication Sig  . bimatoprost (LUMIGAN) 0.03 % ophthalmic solution Place 1 drop into the right eye at bedtime.   . Carboxymeth-Glycerin-Polysorb (REFRESH OPTIVE ADVANCED) 0.5-1-0.5 % SOLN Apply to eye.  . cholecalciferol (VITAMIN D) 1000 UNITS tablet Take 1 tablet (1,000 Units total) by mouth daily.  . dorzolamide-timolol (COSOPT) 22.3-6.8 MG/ML ophthalmic solution Place 1 drop into both eyes 2 (two) times daily.   Marland Kitchen. losartan-hydrochlorothiazide (HYZAAR) 100-25 MG tablet Take 1 tablet by mouth daily. Needs office visit for further refills  . Multiple Vitamins-Minerals (MULTIVITAMIN) tablet Take 1 tablet by mouth daily.  . vitamin C (ASCORBIC ACID) 500 MG tablet Take 1 tablet (500 mg total) by mouth 2 (two) times daily.   No facility-administered encounter medications on file as of 12/18/2016.     Activities of Daily Living In your present state of health, do you have any difficulty performing the following activities: 12/18/2016  Hearing? N  Vision? N  Difficulty concentrating or making decisions? N  Walking or climbing  stairs? N  Dressing or bathing? N  Doing errands, shopping? N  Preparing Food and eating ? N  Using the Toilet? N  In the past six months, have you accidently leaked urine? N  Do you have problems with loss of bowel control? N  Managing your Medications? N  Managing your Finances? N  Housekeeping or managing your Housekeeping? N  Some recent data might be hidden    Patient Care Team: Myrlene Brokerrawford, Elizabeth A, MD as PCP - General (Internal Medicine) Mardella LaymanPatterson, David R, MD as Attending Physician (Gastroenterology) Loraine GripMoya, Harrietta GuardianFrank Joseph, MD (Ophthalmology)    Assessment:    Physical assessment deferred to PCP.  Exercise Activities  and Dietary recommendations Current Exercise Habits: Home exercise routine, Type of exercise: walking, Time (Minutes): 35, Frequency (Times/Week): 4, Weekly Exercise (Minutes/Week): 140, Intensity: Mild, Exercise limited by: orthopedic condition(s)  Diet (meal preparation, eat out, water intake, caffeinated beverages, dairy products, fruits and vegetables): in general, a "healthy" diet  , well balanced, eats a variety of fruits and vegetables daily, limits salt, fat/cholesterol, sugar, caffeine, drinks 1 glass of water daily.  Encouraged patient to increase daily water intake.   Goals    None     Fall Risk Fall Risk  12/18/2016 09/18/2015 09/06/2014 03/31/2013 03/22/2012  Falls in the past year? No No No No No  Risk for fall due to : Impaired balance/gait - - - -   Depression Screen PHQ 2/9 Scores 12/18/2016 09/18/2015 09/06/2014 03/31/2013  PHQ - 2 Score 1 0 0 1  PHQ- 9 Score 3 - - -     Cognitive Function MMSE - Mini Mental State Exam 12/18/2016  Orientation to time 5  Orientation to Place 5  Registration 3  Attention/ Calculation 5  Recall 2  Language- name 2 objects 2  Language- repeat 1  Language- follow 3 step command 3  Language- read & follow direction 1  Write a sentence 1  Copy design 1  Total score 29        Immunization History  Administered Date(s) Administered  . Pneumococcal Conjugate-13 12/29/2013  . Pneumococcal Polysaccharide-23 03/22/2012  . Tetanus 03/07/2011   Screening Tests Health Maintenance  Topic Date Due  . TETANUS/TDAP  03/06/2021  . DEXA SCAN  Completed  . PNA vac Low Risk Adult  Completed      Plan:    Continue doing brain stimulating activities (puzzles, reading, adult coloring books, staying active) to keep memory sharp.   Continue to eat heart healthy diet (full of fruits, vegetables, whole grains, lean protein, water--limit salt, fat, and sugar intake) and increase physical activity as tolerated.  I have personally reviewed and noted  the following in the patient's chart:   . Medical and social history . Use of alcohol, tobacco or illicit drugs  . Current medications and supplements . Functional ability and status . Nutritional status . Physical activity . Advanced directives . List of other physicians . Vitals . Screenings to include cognitive, depression, and falls . Referrals and appointments  In addition, I have reviewed and discussed with patient certain preventive protocols, quality metrics, and best practice recommendations. A written personalized care plan for preventive services as well as general preventive health recommendations were provided to patient.     Wanda PlumpJill A Darienne Belleau, RN  12/18/2016

## 2016-12-18 ENCOUNTER — Encounter: Payer: Self-pay | Admitting: Internal Medicine

## 2016-12-18 ENCOUNTER — Ambulatory Visit (INDEPENDENT_AMBULATORY_CARE_PROVIDER_SITE_OTHER): Payer: Medicare Other | Admitting: Internal Medicine

## 2016-12-18 ENCOUNTER — Other Ambulatory Visit (INDEPENDENT_AMBULATORY_CARE_PROVIDER_SITE_OTHER): Payer: Medicare Other

## 2016-12-18 VITALS — BP 124/72 | HR 62 | Temp 97.8°F | Resp 20 | Ht 62.0 in | Wt 152.0 lb

## 2016-12-18 DIAGNOSIS — I10 Essential (primary) hypertension: Secondary | ICD-10-CM

## 2016-12-18 DIAGNOSIS — Z0000 Encounter for general adult medical examination without abnormal findings: Secondary | ICD-10-CM | POA: Diagnosis not present

## 2016-12-18 LAB — COMPREHENSIVE METABOLIC PANEL
ALT: 13 U/L (ref 0–35)
AST: 17 U/L (ref 0–37)
Albumin: 4.1 g/dL (ref 3.5–5.2)
Alkaline Phosphatase: 54 U/L (ref 39–117)
BUN: 27 mg/dL — ABNORMAL HIGH (ref 6–23)
CO2: 31 meq/L (ref 19–32)
Calcium: 10.4 mg/dL (ref 8.4–10.5)
Chloride: 103 mEq/L (ref 96–112)
Creatinine, Ser: 1.28 mg/dL — ABNORMAL HIGH (ref 0.40–1.20)
GFR: 51.38 mL/min — AB (ref >60.00)
GLUCOSE: 145 mg/dL — AB (ref 70–99)
Potassium: 4.4 mEq/L (ref 3.5–5.1)
Sodium: 141 mEq/L (ref 135–145)
Total Bilirubin: 0.4 mg/dL (ref 0.2–1.2)
Total Protein: 7.3 g/dL (ref 6.0–8.3)

## 2016-12-18 LAB — CBC
HEMATOCRIT: 38.1 % (ref 36.0–46.0)
Hemoglobin: 13.2 g/dL (ref 12.0–15.0)
MCHC: 34.5 g/dL (ref 30.0–36.0)
MCV: 90.6 fl (ref 78.0–100.0)
Platelets: 272 10*3/uL (ref 150.0–400.0)
RBC: 4.21 Mil/uL (ref 3.87–5.11)
RDW: 12.5 % (ref 11.5–15.5)
WBC: 6.2 10*3/uL (ref 4.0–10.5)

## 2016-12-18 LAB — LIPID PANEL
Cholesterol: 163 mg/dL (ref 0–200)
HDL: 55.1 mg/dL (ref >39.00)
LDL Cholesterol: 89 mg/dL (ref 0–99)
NONHDL: 107.87
Total CHOL/HDL Ratio: 3
Triglycerides: 92 mg/dL (ref 0.0–149.0)
VLDL: 18.4 mg/dL (ref 0.0–40.0)

## 2016-12-18 MED ORDER — LOSARTAN POTASSIUM-HCTZ 100-25 MG PO TABS: 1.0000 | ORAL_TABLET | Freq: Every day | ORAL | 3 refills | Status: DC

## 2016-12-18 NOTE — Progress Notes (Signed)
Patient ID: Melissa Warren, female   DOB: 01/23/1934, 83 y.o.   MRN: [id number] Medical screening examination/treatment/procedure(s) were performed by non-physician practitioner and as supervising physician I was immediately available for consultation/collaboration. I agree with above. Myrlene BrokerElizabeth A Crawford, MD

## 2016-12-18 NOTE — Assessment & Plan Note (Signed)
Checking labs, declines flu shot. Tetanus up to date and pneumonia series completed. Counseled about shingrix. Counseled about sun safety and mole surveillance. Given 10 year screening recommendations.

## 2016-12-18 NOTE — Assessment & Plan Note (Addendum)
Checking CMP today and adjust losartan/hctz and adjust as needed. Refill meds today. BP at goal

## 2016-12-18 NOTE — Patient Instructions (Addendum)
Continue doing brain stimulating activities (puzzles, reading, adult coloring books, staying active) to keep memory sharp.   Continue to eat heart healthy diet (full of fruits, vegetables, whole grains, lean protein, water--limit salt, fat, and sugar intake) and increase physical activity as tolerated.   Ms. Melissa Warren , Thank you for taking time to come for your Medicare Wellness Visit. I appreciate your ongoing commitment to your health goals. Please review the following plan we discussed and let me know if I can assist you in the future.   These are the goals we discussed: Stay as healthy and as independent, I will continue to walk and be active, watch what I eat, start to drink a lot more water, enjoy life and family.  Goals    None      This is a list of the screening recommended for you and due dates:  Health Maintenance  Topic Date Due  . Tetanus Vaccine  03/06/2021  . DEXA scan (bone density measurement)  Completed  . Pneumonia vaccines  Completed

## 2016-12-18 NOTE — Progress Notes (Signed)
   Subjective:    Patient ID: Melissa Warren, female    DOB: 12-26-1933, 83 y.o.   MRN: [id number]  HPI The patient is an 83 YO female coming in for physical. No new concerns.  [facility], [facility], social history reviewed and updated.   Review of Systems  Constitutional: Negative.   HENT: Negative.   Eyes: Negative.   Respiratory: Negative for cough, chest tightness and shortness of breath.   Cardiovascular: Negative for chest pain, palpitations and leg swelling.  Gastrointestinal: Negative for abdominal distention, abdominal pain, constipation, diarrhea, nausea and vomiting.  Musculoskeletal: Negative.   Skin: Negative.   Neurological: Negative.   Psychiatric/Behavioral: Negative.       Objective:   Physical Exam  Constitutional: She is oriented to person, place, and time. She appears well-developed and well-nourished.  HENT:  Head: Normocephalic and atraumatic.  Eyes: EOM are normal.  Neck: Normal range of motion.  Cardiovascular: Normal rate and regular rhythm.  Pulmonary/Chest: Effort normal and breath sounds normal. No respiratory distress. She has no wheezes. She has no rales.  Abdominal: Soft. Bowel sounds are normal. She exhibits no distension. There is no tenderness. There is no rebound.  Musculoskeletal: She exhibits no edema.  Neurological: She is alert and oriented to person, place, and time. Coordination normal.  Skin: Skin is warm and dry.  Psychiatric: She has a normal mood and affect.   Vitals:   12/18/16 0815  BP: 124/72  Pulse: 62  Resp: 20  Temp: 97.8 F (36.6 C)  SpO2: 100%  Weight: 152 lb (68.9 kg)  Height: 5\' 2"  (1.575 m)      Assessment & Plan:

## 2017-01-28 DIAGNOSIS — H04123 Dry eye syndrome of bilateral lacrimal glands: Secondary | ICD-10-CM | POA: Diagnosis not present

## 2017-01-28 DIAGNOSIS — H16143 Punctate keratitis, bilateral: Secondary | ICD-10-CM | POA: Diagnosis not present

## 2017-07-01 DIAGNOSIS — H401132 Primary open-angle glaucoma, bilateral, moderate stage: Secondary | ICD-10-CM | POA: Diagnosis not present

## 2017-09-28 DIAGNOSIS — Z961 Presence of intraocular lens: Secondary | ICD-10-CM | POA: Diagnosis not present

## 2017-09-28 DIAGNOSIS — H401132 Primary open-angle glaucoma, bilateral, moderate stage: Secondary | ICD-10-CM | POA: Diagnosis not present

## 2017-12-13 ENCOUNTER — Other Ambulatory Visit: Payer: Self-pay | Admitting: Internal Medicine

## 2017-12-21 NOTE — Progress Notes (Signed)
Subjective:   Melissa Warren is a 84 y.o. female who presents for Medicare Annual (Subsequent) preventive examination.  Review of Systems:  No ROS.  Medicare Wellness Visit. Additional risk factors are reflected in the social history.  Cardiac Risk Factors include: hypertension;advanced age (>33men, >11 women) Sleep patterns: gets up 1-2 times nightly to void and sleeps 7 hours nightly.    Home Safety/Smoke Alarms: Feels safe in home. Smoke alarms in place.  Living environment; residence and Firearm Safety: 1-story house/ trailer, no firearms Lives alone, no needs for DME, good support system. Seat Belt Safety/Bike Helmet: Wears seat belt.     Objective:     Vitals: BP 110/70   Pulse 61   Ht 5\' 2"  (1.575 m)   Wt 156 lb (70.8 kg)   SpO2 99%   BMI 28.53 kg/m   Body mass index is 28.53 kg/m.  Advanced Directives 12/22/2017 12/18/2016  Does Patient Have a Medical Advance Directive? Yes Yes  Type of Estate agent of [place];Living will Healthcare Power of [place];Living will    Tobacco Social History   Tobacco Use  Smoking Status Former Smoker  . Last attempt to quit: 02/11/1975  . Years since quitting: 42.8  Smokeless Tobacco Never Used     Counseling given: Not Answered  Past Medical History:  Diagnosis Date  . Blood transfusion 1977   after hysterectomy  . Cataract   . Constipation   . Glaucoma    surgery "im[facility]" B in 2014 at [facility]  . Hypertension    Past Surgical History:  Procedure Laterality Date  . ABDOMINAL HYSTERECTOMY  1977  . CATARACT EXTRACTION  2014 and 2015   [facility]  . CESAREAN SECTION  1965  . GLAUCOMA SURGERY  2010/2012   Laser bilaterally and micro shunt implant in right eye  . GLAUCOMA VALVE INSERTION  12/2011   dr frank moya, right eye   Family History  Problem Relation Age of Onset  . Colon cancer Sister 55  . Prostate cancer Paternal Grandfather   . Hypertension Maternal Grandmother   . Diabetes Maternal  Grandmother   . Diabetes Mother   . Diabetes Brother   . Diabetes Brother   . Diabetes Brother   . Diabetes Sister   . Diabetes Sister   . Diabetes Sister   . Hypertension Sister   . Hypertension Brother   . Stroke Sister   . Kidney failure Unknown    Social History   Socioeconomic History  . Marital status: Widowed    Spouse name: Not on file  . Number of children: 1  . Years of education: Not on file  . Highest education level: Not on file  Occupational History  . Not on file  Social Needs  . Financial resource strain: Not hard at all  . Food insecurity:    Worry: Never true    Inability: Never true  . Transportation needs:    Medical: No    Non-medical: No  Tobacco Use  . Smoking status: Former Smoker    Last attempt to quit: 02/11/1975    Years since quitting: 42.8  . Smokeless tobacco: Never Used  Substance and Sexual Activity  . Alcohol use: No    Alcohol/week: 0.0 standard drinks  . Drug use: No  . Sexual activity: Not Currently  Lifestyle  . Physical activity:    Days per week: 0 days    Minutes per session: 0 min  . Stress: To some extent  Relationships  . Social connections:    Talks on phone: More than three times a week    Gets together: More than three times a week    Attends religious service: More than 4 times per year    Active member of club or organization: Yes    Attends meetings of clubs or organizations: More than 4 times per year    Relationship status: Widowed  Other Topics Concern  . 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 bimatoprost (LUMIGAN) 0.03 % ophthalmic solution Place 1 drop into the right eye at bedtime.   . cholecalciferol (VITAMIN D) 1000 UNITS tablet Take 1 tablet (1,000 Units total) by mouth daily.  . dorzolamide-timolol (COSOPT) 22.3-6.8 MG/ML  ophthalmic solution Place 1 drop into both eyes 2 (two) times daily.   Marland Kitchen losartan-hydrochlorothiazide (HYZAAR) 100-12.5 MG tablet Take 1 tablet by mouth daily.  . Multiple Vitamins-Minerals (MULTIVITAMIN) tablet Take 1 tablet by mouth daily.  . pantoprazole (PROTONIX) 40 MG tablet Take 1 tablet (40 mg total) by mouth daily.  . RHOPRESSA 0.02 % SOLN   . vitamin C (ASCORBIC ACID) 500 MG tablet Take 1 tablet (500 mg total) by mouth 2 (two) times daily.  . [DISCONTINUED] Carboxymeth-Glycerin-Polysorb (REFRESH OPTIVE ADVANCED) 0.5-1-0.5 % SOLN Apply to eye.  . [DISCONTINUED] losartan-hydrochlorothiazide (HYZAAR) 100-25 MG tablet Take 1 tablet daily by mouth.   No facility-administered encounter medications on file as of 12/22/2017.     Activities of Daily Living In your present state of health, do you have any difficulty performing the following activities: 12/22/2017  Hearing? N  Vision? N  Difficulty concentrating or making decisions? N  Walking or climbing stairs? N  Dressing or bathing? N  Doing errands, shopping? N  Preparing Food and eating ? N  Using the Toilet? N  In the past six months, have you accidently leaked urine? N  Do you have problems with loss of bowel control? N  Managing your Medications? N  Managing your Finances? N  Housekeeping or managing your Housekeeping? N  Some recent data might be hidden    Patient Care Team: Myrlene Broker, MD as PCP - General (Internal Medicine) Mardella Layman, MD as Attending Physician (Gastroenterology) Loraine Grip Harrietta Guardian, MD (Ophthalmology)    Assessment:   This is a routine wellness examination for Melissa Warren. Physical assessment deferred to PCP.   Exercise Activities and Dietary recommendations Current Exercise Habits: The patient does not participate in regular exercise at present  Diet (meal preparation, eat out, water intake, caffeinated beverages, dairy products, fruits and vegetables): in general, a "healthy" diet  ,  well balanced   Reviewed heart healthy diet. Encouraged patient to increase daily water and healthy fluid intake.  Goals    . Patient Stated     Get better control over my diet by monitoring and doing healthy choices. Stay physically and socially active. Drink fluids often and try to get better sleep by adopting some of the recommended sleep tips.       Fall Risk Fall Risk  12/22/2017 12/22/2017 12/18/2016 09/18/2015 09/06/2014  Falls in the past year? 1 1 No No No  Number falls in past yr: 0 0 - - -  Injury with Fall? 0 0 - - -  Risk for fall due to : - -  Impaired balance/gait - -   Depression Screen PHQ 2/9 Scores 12/22/2017 12/22/2017 12/18/2016 09/18/2015  PHQ - 2 Score 0 0 1 0  PHQ- 9 Score - - 3 -     Cognitive Function MMSE - Mini Mental State Exam 12/18/2016  Orientation to time 5  Orientation to Place 5  Registration 3  Attention/ Calculation 5  Recall 2  Language- name 2 objects 2  Language- repeat 1  Language- follow 3 step command 3  Language- read & follow direction 1  Write a sentence 1  Copy design 1  Total score 29       Ad8 score reviewed for issues:  Issues making decisions: no  Less interest in hobbies / activities: no  Repeats questions, stories (family complaining): no  Trouble using ordinary gadgets (microwave, computer, phone):no  Forgets the month or year: no  Mismanaging finances: no  Remembering appts: no  Daily problems with thinking and/or memory: no Ad8 score is= 0  Immunization History  Administered Date(s) Administered  . Pneumococcal Conjugate-13 12/29/2013  . Pneumococcal Polysaccharide-23 03/22/2012  . Tetanus 03/07/2011   Screening Tests Health Maintenance  Topic Date Due  . TETANUS/TDAP  03/06/2021  . DEXA SCAN  Completed  . PNA vac Low Risk Adult  Completed      Plan:     Continue doing brain stimulating activities (puzzles, reading, adult coloring books, staying active) to keep memory sharp.   Continue to eat  heart healthy diet (full of fruits, vegetables, whole grains, lean protein, water--limit salt, fat, and sugar intake) and increase physical activity as tolerated.  I have personally reviewed and noted the following in the patient's chart:   . Medical and social history . Use of alcohol, tobacco or illicit drugs  . Current medications and supplements . Functional ability and status . Nutritional status . Physical activity . Advanced directives . List of other physicians . Vitals . Screenings to include cognitive, depression, and falls . Referrals and appointments  In addition, I have reviewed and discussed with patient certain preventive protocols, quality metrics, and best practice recommendations. A written personalized care plan for preventive services as well as general preventive health recommendations were provided to patient.     Wanda Plump, RN  12/23/2017

## 2017-12-22 ENCOUNTER — Ambulatory Visit (INDEPENDENT_AMBULATORY_CARE_PROVIDER_SITE_OTHER): Payer: Medicare Other | Admitting: Internal Medicine

## 2017-12-22 ENCOUNTER — Other Ambulatory Visit (INDEPENDENT_AMBULATORY_CARE_PROVIDER_SITE_OTHER): Payer: Medicare Other

## 2017-12-22 ENCOUNTER — Ambulatory Visit (INDEPENDENT_AMBULATORY_CARE_PROVIDER_SITE_OTHER): Payer: Medicare Other | Admitting: *Deleted

## 2017-12-22 ENCOUNTER — Encounter: Payer: Self-pay | Admitting: Internal Medicine

## 2017-12-22 VITALS — BP 110/70 | HR 61 | Ht 62.0 in | Wt 156.0 lb

## 2017-12-22 VITALS — BP 110/70 | HR 61 | Temp 97.9°F | Ht 62.0 in | Wt 156.0 lb

## 2017-12-22 DIAGNOSIS — R739 Hyperglycemia, unspecified: Secondary | ICD-10-CM

## 2017-12-22 DIAGNOSIS — K219 Gastro-esophageal reflux disease without esophagitis: Secondary | ICD-10-CM | POA: Insufficient documentation

## 2017-12-22 DIAGNOSIS — I10 Essential (primary) hypertension: Secondary | ICD-10-CM

## 2017-12-22 DIAGNOSIS — H9313 Tinnitus, bilateral: Secondary | ICD-10-CM

## 2017-12-22 DIAGNOSIS — Z0000 Encounter for general adult medical examination without abnormal findings: Secondary | ICD-10-CM | POA: Diagnosis not present

## 2017-12-22 LAB — CBC
HEMATOCRIT: 37.2 % (ref 36.0–46.0)
Hemoglobin: 12.9 g/dL (ref 12.0–15.0)
MCHC: 34.8 g/dL (ref 30.0–36.0)
MCV: 89.9 fl (ref 78.0–100.0)
Platelets: 277 10*3/uL (ref 150.0–400.0)
RBC: 4.13 Mil/uL (ref 3.87–5.11)
RDW: 12.3 % (ref 11.5–15.5)
WBC: 5.7 10*3/uL (ref 4.0–10.5)

## 2017-12-22 LAB — COMPREHENSIVE METABOLIC PANEL
ALK PHOS: 55 U/L (ref 39–117)
ALT: 12 U/L (ref 0–35)
AST: 17 U/L (ref 0–37)
Albumin: 4.2 g/dL (ref 3.5–5.2)
BUN: 21 mg/dL (ref 6–23)
CO2: 30 mEq/L (ref 19–32)
Calcium: 9.9 mg/dL (ref 8.4–10.5)
Chloride: 102 mEq/L (ref 96–112)
Creatinine, Ser: 1.13 mg/dL (ref 0.40–1.20)
GFR: 59.18 mL/min — ABNORMAL LOW (ref >60.00)
GLUCOSE: 102 mg/dL — AB (ref 70–99)
POTASSIUM: 4.3 meq/L (ref 3.5–5.1)
SODIUM: 139 meq/L (ref 135–145)
TOTAL PROTEIN: 7.3 g/dL (ref 6.0–8.3)
Total Bilirubin: 0.6 mg/dL (ref 0.2–1.2)

## 2017-12-22 LAB — LIPID PANEL
Cholesterol: 166 mg/dL (ref 0–200)
HDL: 50.4 mg/dL (ref >39.00)
LDL Cholesterol: 95 mg/dL (ref 0–99)
NONHDL: 115.34
Total CHOL/HDL Ratio: 3
Triglycerides: 102 mg/dL (ref 0.0–149.0)
VLDL: 20.4 mg/dL (ref 0.0–40.0)

## 2017-12-22 LAB — HEMOGLOBIN A1C: HEMOGLOBIN A1C: 6.2 % (ref 4.6–6.5)

## 2017-12-22 MED ORDER — PANTOPRAZOLE SODIUM 40 MG PO TBEC: 40.0000 mg | DELAYED_RELEASE_TABLET | Freq: Every day | ORAL | 1 refills | Status: DC

## 2017-12-22 MED ORDER — ZOSTER VAC RECOMB ADJUVANTED 50 MCG/0.5ML IM SUSR: 0.5000 mL | Freq: Once | INTRAMUSCULAR | 1 refills | Status: AC

## 2017-12-22 MED ORDER — LOSARTAN POTASSIUM-HCTZ 100-12.5 MG PO TABS: 1.0000 | ORAL_TABLET | Freq: Every day | ORAL | 3 refills | Status: DC

## 2017-12-22 NOTE — Assessment & Plan Note (Signed)
BP at goal but more urination. Will lower losartan/hctz to 100/12.5 mg daily and ask her to watch BP weekly for several months.

## 2017-12-22 NOTE — Assessment & Plan Note (Signed)
Rx for protonix to take daily for 2-4 weeks and then stop if needed.

## 2017-12-22 NOTE — Assessment & Plan Note (Signed)
Checking HgA1c and adjust as needed.  

## 2017-12-22 NOTE — Patient Instructions (Signed)
We have sent in protonix (pantoprazole) to take 1 pill daily before breakfast for 2-4 weeks and then try stopping.   We have sent in the new dose of the blood pressure medicine.   We are going to get you in with a hearing specialist.   We have given you the exercises for the bladder to try.    Food Choices for Gastroesophageal Reflux Disease, Adult When you have gastroesophageal reflux disease (GERD), the foods you eat and your eating habits are very important. Choosing the right foods can help ease your discomfort. What guidelines do I need to follow?  Choose fruits, vegetables, whole grains, and low-fat dairy products.  Choose low-fat meat, fish, and poultry.  Limit fats such as oils, salad dressings, butter, nuts, and avocado.  Keep a food diary. This helps you identify foods that cause symptoms.  Avoid foods that cause symptoms. These may be different for everyone.  Eat small meals often instead of 3 large meals a day.  Eat your meals slowly, in a place where you are relaxed.  Limit fried foods.  Cook foods using methods other than frying.  Avoid drinking alcohol.  Avoid drinking large amounts of liquids with your meals.  Avoid bending over or lying down until 2-3 hours after eating. What foods are not recommended? These are some foods and drinks that may make your symptoms worse: Vegetables Tomatoes. Tomato juice. Tomato and spaghetti sauce. Chili peppers. Onion and garlic. Horseradish. Fruits Oranges, grapefruit, and lemon (fruit and juice). Meats High-fat meats, fish, and poultry. This includes hot dogs, ribs, ham, sausage, salami, and bacon. Dairy Whole milk and chocolate milk. Sour cream. Cream. Butter. Ice cream. Cream cheese. Drinks Coffee and tea. Bubbly (carbonated) drinks or energy drinks. Condiments Hot sauce. Barbecue sauce. Sweets/Desserts Chocolate and cocoa. Donuts. Peppermint and spearmint. Fats and Oils High-fat foods. This includes [place]  fries and potato chips. Other Vinegar. Strong spices. This includes black pepper, white pepper, red pepper, cayenne, curry powder, cloves, ginger, and chili powder. The items listed above may not be a complete list of foods and drinks to avoid. Contact your dietitian for more information. This information is not intended to replace advice given to you by your health care provider. Make sure you discuss any questions you have with your health care provider. Document Released: 07/29/2011 Document Revised: 07/05/2015 Document Reviewed: 12/01/2012 Elsevier Interactive Patient Education  2017 Elsevier Inc.    Kegel Exercises Kegel exercises help strengthen the muscles that support the rectum, vagina, small intestine, bladder, and uterus. Doing Kegel exercises can help:  Improve bladder and bowel control.  Improve sexual response.  Reduce problems and discomfort during pregnancy.  Kegel exercises involve squeezing your pelvic floor muscles, which are the same muscles you squeeze when you try to stop the flow of urine. The exercises can be done while sitting, standing, or lying down, but it is best to vary your position. Phase 1 exercises 1. Squeeze your pelvic floor muscles tight. You should feel a tight lift in your rectal area. If you are a female, you should also feel a tightness in your vaginal area. Keep your stomach, buttocks, and legs relaxed. 2. Hold the muscles tight for up to 10 seconds. 3. Relax your muscles. Repeat this exercise 50 times a day or as many times as told by your health care provider. Continue to do this exercise for at least 4-6 weeks or for as long as told by your health care provider. This information is not intended  to replace advice given to you by your health care provider. Make sure you discuss any questions you have with your health care provider. Document Released: 01/14/2012 Document Revised: 09/22/2015 Document Reviewed: 12/17/2014 Elsevier Interactive  Patient Education  [employer].

## 2017-12-22 NOTE — Progress Notes (Signed)
   Subjective:    Patient ID: Melissa Warren, female    DOB: 1933/08/02, 84 y.o.   MRN: [id number]  HPI The patient is an 84 YO female coming in for follow up of blood pressure (taking losartan/hctz and BP at goal, denies side effects, denies chest pains or headaches, denies lightheadedness or dizziness), blood sugars (have been following for elevated levels, denies changes in diet or exercise, single HgA1c above 6.5 and several in pre-diabetes range but generally okay in recent years) and GERD (has rare night time symptoms but in the last several weeks having symptoms all day long and denies major changes in diet, does snack right before bed which she knows is bad, denies black stools or blood in stool).   Review of Systems  Constitutional: Negative.   HENT: Negative.   Eyes: Negative.   Respiratory: Negative for cough, chest tightness and shortness of breath.   Cardiovascular: Negative for chest pain, palpitations and leg swelling.  Gastrointestinal: Negative for abdominal distention, abdominal pain, constipation, diarrhea, nausea and vomiting.       GERD  Musculoskeletal: Negative.   Skin: Negative.   Neurological: Negative.   Psychiatric/Behavioral: Negative.       Objective:   Physical Exam  Constitutional: She is oriented to person, place, and time. She appears well-developed and well-nourished.  HENT:  Head: Normocephalic and atraumatic.  Eyes: EOM are normal.  Neck: Normal range of motion.  Cardiovascular: Normal rate and regular rhythm.  Pulmonary/Chest: Effort normal and breath sounds normal. No respiratory distress. She has no wheezes. She has no rales.  Abdominal: Soft. Bowel sounds are normal. She exhibits no distension. There is no tenderness. There is no rebound.  Musculoskeletal: She exhibits no edema.  Neurological: She is alert and oriented to person, place, and time. Coordination normal.  Skin: Skin is warm and dry.  Psychiatric: She has a normal mood and affect.    Vitals:   12/22/17 1256  BP: 110/70  Pulse: 61  Temp: 97.9 F (36.6 C)  TempSrc: Oral  SpO2: 99%  Weight: 156 lb (70.8 kg)  Height: 5\' 2"  (1.575 m)      Assessment & Plan:

## 2017-12-22 NOTE — Patient Instructions (Addendum)
Continue doing brain stimulating activities (puzzles, reading, adult coloring books, staying active) to keep memory sharp.   Continue to eat heart healthy diet (full of fruits, vegetables, whole grains, lean protein, water--limit salt, fat, and sugar intake) and increase physical activity as tolerated.   Ms. Melissa Warren , Thank you for taking time to come for your Medicare Wellness Visit. I appreciate your ongoing commitment to your health goals. Please review the following plan we discussed and let me know if I can assist you in the future.   These are the goals we discussed: Goals    . Patient Stated     Get better control over my diet by monitoring and doing healthy choices. Stay physically and socially active. Drink fluids often and try to get better sleep by adopting some of the recommended sleep tips.       This is a list of the screening recommended for you and due dates:  Health Maintenance  Topic Date Due  . Tetanus Vaccine  03/06/2021  . DEXA scan (bone density measurement)  Completed  . Pneumonia vaccines  Completed    Neck Exercises Neck exercises can be important for many reasons:  They can help you to improve and maintain flexibility in your neck. This can be especially important as you age.  They can help to make your neck stronger. This can make movement easier.  They can reduce or prevent neck pain.  They may help your upper back.  Ask your health care provider which neck exercises would be best for you. Exercises Neck Press Repeat this exercise 10 times. Do it first thing in the morning and right before bed or as told by your health care provider. 1. Lie on your back on a firm bed or on the floor with a pillow under your head. 2. Use your neck muscles to push your head down on the pillow and straighten your spine. 3. Hold the position as well as you can. Keep your head facing up and your chin tucked. 4. Slowly count to 5 while holding this position. 5. Relax for a  few seconds. Then repeat.  Isometric Strengthening Do a full set of these exercises 2 times a day or as told by your health care provider. 1. Sit in a supportive chair and place your hand on your forehead. 2. Push forward with your head and neck while pushing back with your hand. Hold for 10 seconds. 3. Relax. Then repeat the exercise 3 times. 4. Next, do thesequence again, this time putting your hand against the back of your head. Use your head and neck to push backward against the hand pressure. 5. Finally, do the same exercise on either side of your head, pushing sideways against the pressure of your hand.  Prone Head Lifts Repeat this exercise 5 times. Do this 2 times a day or as told by your health care provider. 1. Lie face-down, resting on your elbows so that your chest and upper back are raised. 2. Start with your head facing downward, near your chest. Position your chin either on or near your chest. 3. Slowly lift your head upward. Lift until you are looking straight ahead. Then continue lifting your head as far back as you can stretch. 4. Hold your head up for 5 seconds. Then slowly lower it to your starting position.  Supine Head Lifts Repeat this exercise 8-10 times. Do this 2 times a day or as told by your health care provider. 1. Lie on your  back, bending your knees to point to the ceiling and keeping your feet flat on the floor. 2. Lift your head slowly off the floor, raising your chin toward your chest. 3. Hold for 5 seconds. 4. Relax and repeat.  Scapular Retraction Repeat this exercise 5 times. Do this 2 times a day or as told by your health care provider. 1. Stand with your arms at your sides. Look straight ahead. 2. Slowly pull both shoulders backward and downward until you feel a stretch between your shoulder blades in your upper back. 3. Hold for 10-30 seconds. 4. Relax and repeat.  Contact a health care provider if:  Your neck pain or discomfort gets much  worse when you do an exercise.  Your neck pain or discomfort does not improve within 2 hours after you exercise. If you have any of these problems, stop exercising right away. Do not do the exercises again unless your health care provider says that you can. Get help right away if:  You develop sudden, severe neck pain. If this happens, stop exercising right away. Do not do the exercises again unless your health care provider says that you can. Exercises Neck Stretch  Repeat this exercise 3-5 times. 1. Do this exercise while standing or while sitting in a chair. 2. Place your feet flat on the floor, shoulder-width apart. 3. Slowly turn your head to the right. Turn it all the way to the right so you can look over your right shoulder. Do not tilt or tip your head. 4. Hold this position for 10-30 seconds. 5. Slowly turn your head to the left, to look over your left shoulder. 6. Hold this position for 10-30 seconds.  Neck Retraction Repeat this exercise 8-10 times. Do this 3-4 times a day or as told by your health care provider. 1. Do this exercise while standing or while sitting in a sturdy chair. 2. Look straight ahead. Do not bend your neck. 3. Use your fingers to push your chin backward. Do not bend your neck for this movement. Continue to face straight ahead. If you are doing the exercise properly, you will feel a slight sensation in your throat and a stretch at the back of your neck. 4. Hold the stretch for 1-2 seconds. Relax and repeat.  This information is not intended to replace advice given to you by your health care provider. Make sure you discuss any questions you have with your health care provider. Document Released: 01/08/2015 Document Revised: 07/05/2015 Document Reviewed: 08/07/2014 Elsevier Interactive Patient Education  [employer]2018 [employer] Inc.

## 2017-12-24 NOTE — Progress Notes (Signed)
Medical screening examination/treatment/procedure(s) were performed by non-physician practitioner and as supervising physician I was immediately available for consultation/collaboration. I agree with above.  A , MD 

## 2018-04-15 DIAGNOSIS — H401133 Primary open-angle glaucoma, bilateral, severe stage: Secondary | ICD-10-CM | POA: Diagnosis not present

## 2018-04-21 DIAGNOSIS — Z961 Presence of intraocular lens: Secondary | ICD-10-CM | POA: Diagnosis not present

## 2018-04-21 DIAGNOSIS — H401132 Primary open-angle glaucoma, bilateral, moderate stage: Secondary | ICD-10-CM | POA: Diagnosis not present

## 2018-08-02 DIAGNOSIS — H401132 Primary open-angle glaucoma, bilateral, moderate stage: Secondary | ICD-10-CM | POA: Diagnosis not present

## 2018-08-02 DIAGNOSIS — Z961 Presence of intraocular lens: Secondary | ICD-10-CM | POA: Diagnosis not present

## 2018-11-03 ENCOUNTER — Other Ambulatory Visit: Payer: Self-pay | Admitting: Internal Medicine

## 2018-11-03 MED ORDER — LOSARTAN POTASSIUM-HCTZ 100-12.5 MG PO TABS: 1.0000 | ORAL_TABLET | Freq: Every day | ORAL | 0 refills | Status: DC

## 2018-11-03 NOTE — Telephone Encounter (Signed)
Requested medication (s) are due for refill today: yes  Requested medication (s) are on the active medication list: yes  Last refill: 12/22/2017  Future visit scheduled: yes  Notes to clinic:  Review for refill   Requested Prescriptions  Pending Prescriptions Disp Refills   losartan-hydrochlorothiazide (HYZAAR) 100-12.5 MG tablet 90 tablet 3    Sig: Take 1 tablet by mouth daily.     Cardiovascular: ARB + Diuretic Combos Failed - 11/03/2018 10:51 AM      Failed - K in normal range and within 180 days    Potassium  Date Value Ref Range Status  12/22/2017 4.3 3.5 - 5.1 mEq/L Final         Failed - Na in normal range and within 180 days    Sodium  Date Value Ref Range Status  12/22/2017 139 135 - 145 mEq/L Final         Failed - Cr in normal range and within 180 days    Creatinine, Ser  Date Value Ref Range Status  12/22/2017 1.13 0.40 - 1.20 mg/dL Final         Failed - Ca in normal range and within 180 days    Calcium  Date Value Ref Range Status  12/22/2017 9.9 8.4 - 10.5 mg/dL Final         Failed - Valid encounter within last 6 months    Recent Outpatient Visits          10 months ago Tinnitus of both ears   [place], Elizabeth A, MD   1 year ago Routine general medical examination at a health care facility   [place], [place], MD   3 years ago Routine general medical examination at a health care facility   [place], Elizabeth A, MD   4 years ago Routine general medical examination at a health care facility   [place], Jannifer Rodney, MD   4 years ago Essential hypertension   [place], Jannifer Rodney, MD      Future Appointments            In 1 month Sharlet Salina Real Cons, MD [place], [place]   In 1 month  [place] - Patient is not pregnant      Passed - Last BP in normal range    BP Readings from Last 1 Encounters:  12/22/17 110/70

## 2018-11-03 NOTE — Telephone Encounter (Signed)
Pt called in to request a refill for losartan-hydrochlorothiazide (HYZAAR) 100-12.5 MG tablet     Pharmacy:  [facility] [place], [place] [phone] (Phone) [phone] (Fax)

## 2018-12-01 DIAGNOSIS — H401132 Primary open-angle glaucoma, bilateral, moderate stage: Secondary | ICD-10-CM | POA: Diagnosis not present

## 2018-12-01 DIAGNOSIS — Z961 Presence of intraocular lens: Secondary | ICD-10-CM | POA: Diagnosis not present

## 2018-12-12 HISTORY — PX: REFRACTIVE SURGERY: SHX103

## 2018-12-23 DIAGNOSIS — H401132 Primary open-angle glaucoma, bilateral, moderate stage: Secondary | ICD-10-CM | POA: Diagnosis not present

## 2018-12-28 ENCOUNTER — Ambulatory Visit: Payer: Medicare Other

## 2018-12-28 ENCOUNTER — Ambulatory Visit: Payer: Medicare Other | Admitting: Internal Medicine

## 2018-12-31 ENCOUNTER — Ambulatory Visit (INDEPENDENT_AMBULATORY_CARE_PROVIDER_SITE_OTHER): Payer: Medicare Other | Admitting: Internal Medicine

## 2018-12-31 ENCOUNTER — Ambulatory Visit (INDEPENDENT_AMBULATORY_CARE_PROVIDER_SITE_OTHER): Payer: Medicare Other | Admitting: *Deleted

## 2018-12-31 ENCOUNTER — Other Ambulatory Visit (INDEPENDENT_AMBULATORY_CARE_PROVIDER_SITE_OTHER): Payer: Medicare Other

## 2018-12-31 ENCOUNTER — Other Ambulatory Visit: Payer: Self-pay

## 2018-12-31 ENCOUNTER — Encounter: Payer: Self-pay | Admitting: Internal Medicine

## 2018-12-31 VITALS — BP 118/84 | HR 66 | Ht 62.0 in | Wt 154.0 lb

## 2018-12-31 VITALS — BP 118/84 | HR 66 | Temp 98.0°F | Ht 62.0 in | Wt 154.0 lb

## 2018-12-31 DIAGNOSIS — Z0000 Encounter for general adult medical examination without abnormal findings: Secondary | ICD-10-CM | POA: Diagnosis not present

## 2018-12-31 DIAGNOSIS — R739 Hyperglycemia, unspecified: Secondary | ICD-10-CM | POA: Diagnosis not present

## 2018-12-31 DIAGNOSIS — H401130 Primary open-angle glaucoma, bilateral, stage unspecified: Secondary | ICD-10-CM

## 2018-12-31 DIAGNOSIS — I10 Essential (primary) hypertension: Secondary | ICD-10-CM | POA: Diagnosis not present

## 2018-12-31 LAB — CBC
HCT: 37.5 % (ref 36.0–46.0)
Hemoglobin: 12.8 g/dL (ref 12.0–15.0)
MCHC: 34.3 g/dL (ref 30.0–36.0)
MCV: 91.4 fl (ref 78.0–100.0)
Platelets: 281 10*3/uL (ref 150.0–400.0)
RBC: 4.1 Mil/uL (ref 3.87–5.11)
RDW: 12.5 % (ref 11.5–15.5)
WBC: 6.1 10*3/uL (ref 4.0–10.5)

## 2018-12-31 LAB — COMPREHENSIVE METABOLIC PANEL
ALT: 12 U/L (ref 0–35)
AST: 15 U/L (ref 0–37)
Albumin: 4.1 g/dL (ref 3.5–5.2)
Alkaline Phosphatase: 58 U/L (ref 39–117)
BUN: 24 mg/dL — ABNORMAL HIGH (ref 6–23)
CO2: 28 mEq/L (ref 19–32)
Calcium: 9.8 mg/dL (ref 8.4–10.5)
Chloride: 102 mEq/L (ref 96–112)
Creatinine, Ser: 1.23 mg/dL — ABNORMAL HIGH (ref 0.40–1.20)
GFR: 50.36 mL/min — ABNORMAL LOW (ref >60.00)
Glucose, Bld: 102 mg/dL — ABNORMAL HIGH (ref 70–99)
Potassium: 3.8 mEq/L (ref 3.5–5.1)
Sodium: 138 mEq/L (ref 135–145)
Total Bilirubin: 0.5 mg/dL (ref 0.2–1.2)
Total Protein: 7.1 g/dL (ref 6.0–8.3)

## 2018-12-31 LAB — LIPID PANEL
Cholesterol: 165 mg/dL (ref 0–200)
HDL: 46.5 mg/dL (ref >39.00)
LDL Cholesterol: 92 mg/dL (ref 0–99)
NonHDL: 118.12
Total CHOL/HDL Ratio: 4
Triglycerides: 133 mg/dL (ref 0.0–149.0)
VLDL: 26.6 mg/dL (ref 0.0–40.0)

## 2018-12-31 LAB — HEMOGLOBIN A1C: Hgb A1c MFr Bld: 6 % (ref 4.6–6.5)

## 2018-12-31 NOTE — Progress Notes (Addendum)
Subjective:   Melissa Warren is a 85 y.o. female who presents for Medicare Annual (Subsequent) preventive examination. Review of Systems:  This visit occurred during the SARS-CoV-2 public health emergency.  Safety protocols were in place, including screening questions prior to the visit, additional usage of staff PPE, and extensive cleaning of exam room while observing appropriate contact time as indicated for disinfecting solutions.   Cardiac Risk Factors include: advanced age (>[id number], 88>65 women);hypertension Sleep patterns: gets up 2 times nightly to void and sleeps hours vary  nightly. Patient reports insomnia issues, discussed recommended sleep tips, education was attached to patient's AVS.  Home Safety/Smoke Alarms: Feels safe in home. Smoke alarms in place.  Living environment; residence and Firearm Safety: split level / walkout. lives alone, no needs for DME, good support system Seat Belt Safety/Bike Helmet: Wears seat belt.    Objective:     Vitals: There were no vitals taken for this visit.  There is no height or weight on file to calculate BMI.  Advanced Directives 12/31/2018 12/22/2017 12/18/2016  Does Patient Have a Medical Advance Directive? Yes Yes Yes  Type of Advance Directive Living will Healthcare Power of [place];Living will Healthcare Power of [place]orney;Living will  Copy of Healthcare Power of Attorney in Chart? - No - copy requested -    Tobacco Social History   Tobacco Use  Smoking Status Former Smoker  . Quit date: 02/11/1975  . Years since quitting: 43.9  Smokeless Tobacco Never Used     Counseling given: Not Answered  Past Medical History:  Diagnosis Date  . Blood transfusion 1977   after hysterectomy  . Cataract   . Constipation   . Glaucoma    [facility]  . Hypertension    Past Surgical History:  Procedure Laterality Date  . ABDOMINAL HYSTERECTOMY  1977  . CATARACT EXTRACTION  2014 and 2015   [facility]  . CESAREAN SECTION   1965  . GLAUCOMA SURGERY  2010/2012   Laser bilaterally and micro shunt implant in right eye  . GLAUCOMA VALVE INSERTION  12/2011   dr frank moya, right eye  . REFRACTIVE SURGERY Left 12/2018   Family History  Problem Relation Age of Onset  . Colon cancer Sister [id number]  . Prostate cancer Paternal Grandfather   . Hypertension Maternal Grandmother   . Diabetes Maternal Grandmother   . Diabetes Mother   . Diabetes Brother   . Diabetes Brother   . Diabetes Brother   . Diabetes Sister   . Diabetes Sister   . Diabetes Sister   . Hypertension Sister   . Hypertension Brother   . Stroke Sister   . Kidney failure Other    Social History   Socioeconomic History  . Marital status: Widowed    Spouse name: Not on file  . Number of children: 1  . Years of education: Not on file  . Highest education level: Not on file  Occupational History  . Occupation: Reitred  Engineer, productionocial Needs  . Financial resource strain: Not hard at all  . Food insecurity    Worry: Never true    Inability: Never true  . Transportation needs    Medical: No    Non-medical: No  Tobacco Use  . Smoking status: Former Smoker    Quit date: 02/11/1975    Years since quitting: 43.9  . Smokeless tobacco: Never Used  Substance and Sexual Activity  . Alcohol use: No    Alcohol/week: 0.0  standard drinks  . Drug use: No  . Sexual activity: Not Currently  Lifestyle  . Physical activity    Days per week: 0 days    Minutes per session: 0 min  . Stress: Only a little  Relationships  . Social connections    Talks on phone: More than three times a week    Gets together: More than three times a week    Attends religious service: More than 4 times per year    Active member of club or organization: Yes    Attends meetings of clubs or organizations: More than 4 times per year    Relationship status: Widowed  Other Topics Concern  . 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 bimatoprost (LUMIGAN) 0.03 % ophthalmic solution Place 1 drop into the right eye at bedtime.   . cholecalciferol (VITAMIN D) 1000 UNITS tablet Take 1 tablet (1,000 Units total) by mouth daily.  . dorzolamide-timolol (COSOPT) 22.3-6.8 MG/ML ophthalmic solution Place 1 drop into both eyes 2 (two) times daily.   Marland Kitchen losartan-hydrochlorothiazide (HYZAAR) 100-12.5 MG tablet Take 1 tablet by mouth daily.  . Multiple Vitamins-Minerals (MULTIVITAMIN) tablet Take 1 tablet by mouth daily.  . RHOPRESSA 0.02 % SOLN   . vitamin C (ASCORBIC ACID) 500 MG tablet Take 1 tablet (500 mg total) by mouth 2 (two) times daily.  . [DISCONTINUED] pantoprazole (PROTONIX) 40 MG tablet Take 1 tablet (40 mg total) by mouth daily. (Patient not taking: Reported on 12/31/2018)   No facility-administered encounter medications on file as of 12/31/2018.     Activities of Daily Living In your present state of health, do you have any difficulty performing the following activities: 12/31/2018  Hearing? N  Vision? N  Difficulty concentrating or making decisions? N  Walking or climbing stairs? N  Dressing or bathing? N  Doing errands, shopping? N  Preparing Food and eating ? N  Using the Toilet? N  In the past six months, have you accidently leaked urine? N  Do you have problems with loss of bowel control? N  Managing your Medications? N  Managing your Finances? N  Housekeeping or managing your Housekeeping? N  Some recent data might be hidden    Patient Care Team: Hoyt Koch, MD as PCP - General (Internal Medicine) Sable Feil, MD as Attending Physician (Gastroenterology) Ander Slade Carlisle Beers, MD (Ophthalmology)    Assessment:   This is a routine wellness examination for Melissa Warren. Physical assessment deferred to PCP.   Exercise Activities and Dietary recommendations Current Exercise  Habits: The patient does not participate in regular exercise at present  Diet (meal preparation, eat out, water intake, caffeinated beverages, dairy products, fruits and vegetables): in general, a "healthy" diet  , well balanced. eats a variety of fruits and vegetables daily,  Encouraged patient to increase daily water and healthy fluid intake.  Goals    . Patient Stated     Get better control over my diet by monitoring and doing healthy choices. Stay physically and socially active. Drink fluids often and try to get better sleep by adopting some of the recommended sleep tips.       Fall Risk Fall Risk  12/31/2018 12/31/2018 12/22/2017 12/22/2017 12/18/2016  Falls in the past year? 0 0 1 1 No  Number  falls in past yr: 0 - 0 0 -  Injury with Fall? 0 - 0 0 -  Risk for fall due to : - - - - Impaired balance/gait   Is the patient's home free of loose throw rugs in walkways, pet beds, electrical cords, etc?   yes      Grab bars in the bathroom? yes      Handrails on the stairs?   yes      Adequate lighting?   yes  Depression Screen PHQ 2/9 Scores 12/31/2018 12/22/2017 12/22/2017 12/18/2016  PHQ - 2 Score 0 0 0 1  PHQ- 9 Score - - - 3     Cognitive Function MMSE - Mini Mental State Exam 12/18/2016  Orientation to time 5  Orientation to Place 5  Registration 3  Attention/ Calculation 5  Recall 2  Language- name 2 objects 2  Language- repeat 1  Language- follow 3 step command 3  Language- read & follow direction 1  Write a sentence 1  Copy design 1  Total score 29     6CIT Screen 12/31/2018  What Year? 0 points  What month? 0 points  What time? 0 points  Count back from 20 0 points  Months in reverse 0 points  Repeat phrase 2 points  Total Score 2    Immunization History  Administered Date(s) Administered  . Pneumococcal Conjugate-13 12/29/2013  . Pneumococcal Polysaccharide-23 03/22/2012  . Tetanus 03/07/2011   Screening Tests Health Maintenance  Topic Date Due   . TETANUS/TDAP  03/06/2021  . DEXA SCAN  Completed  . PNA vac Low Risk Adult  Completed      Plan:    Reviewed health maintenance screenings with patient today and relevant education, vaccines, and/or referrals were provided.   I have personally reviewed and noted the following in the patient's chart:   . Medical and social history . Use of alcohol, tobacco or illicit drugs  . Current medications and supplements . Functional ability and status . Nutritional status . Physical activity . Advanced directives . List of other physicians . Vitals . Screenings to include cognitive, depression, and falls . Referrals and appointments  In addition, I have reviewed and discussed with patient certain preventive protocols, quality metrics, and best practice recommendations. A written personalized care plan for preventive services as well as general preventive health recommendations were provided to patient.     Wanda Plump, RN  12/31/2018

## 2018-12-31 NOTE — Progress Notes (Signed)
   Subjective:   Patient ID: Melissa Warren, female    DOB: 14-Aug-1933, 85 y.o.   MRN: [id number]  HPI The patient is an 85 YO female coming in for follow up of her high sugars (not exercising much lately, diet similar, denies numbness or tingling in hands or feet) and blood pressure (taking losartan/hctz, denies headaches or chest pains, denies side effects or dizziness) and glaucoma (has kept up with recent visits with eye doctor, recent procedure for her eyes, she is taking her eye drops faithfully).   [facility]  Review of Systems  Constitutional: Negative.   HENT: Negative.   Eyes: Negative.   Respiratory: Negative for cough, chest tightness and shortness of breath.   Cardiovascular: Negative for chest pain, palpitations and leg swelling.  Gastrointestinal: Negative for abdominal distention, abdominal pain, constipation, diarrhea, nausea and vomiting.  Musculoskeletal: Negative.   Skin: Negative.   Neurological: Negative.   Psychiatric/Behavioral: Negative.     Objective:  Physical Exam Constitutional:      Appearance: She is well-developed.  HENT:     Head: Normocephalic and atraumatic.  Neck:     Musculoskeletal: Normal range of motion.  Cardiovascular:     Rate and Rhythm: Normal rate and regular rhythm.  Pulmonary:     Effort: Pulmonary effort is normal. No respiratory distress.     Breath sounds: Normal breath sounds. No wheezing or rales.  Abdominal:     General: Bowel sounds are normal. There is no distension.     Palpations: Abdomen is soft.     Tenderness: There is no abdominal tenderness. There is no rebound.  Skin:    General: Skin is warm and dry.  Neurological:     Mental Status: She is alert and oriented to person, place, and time.     Coordination: Coordination normal.     Vitals:   12/31/18 1257  BP: 118/84  Pulse: 66  Temp: 98 F (36.7 C)  TempSrc: Oral  SpO2: 98%  Weight: 154 lb (69.9 kg)  Height: 5\' 2"  (1.575 m)    This  visit occurred during the SARS-CoV-2 public health emergency.  Safety protocols were in place, including screening questions prior to the visit, additional usage of staff PPE, and extensive cleaning of exam room while observing appropriate contact time as indicated for disinfecting solutions.   Assessment & Plan:

## 2018-12-31 NOTE — Patient Instructions (Addendum)
We will check the labs today.    Health Maintenance, Female Adopting a healthy lifestyle and getting preventive care are important in promoting health and wellness. Ask your health care provider about:  The right schedule for you to have regular tests and exams.  Things you can do on your own to prevent diseases and keep yourself healthy. What should I know about diet, weight, and exercise? Eat a healthy diet   Eat a diet that includes plenty of vegetables, fruits, low-fat dairy products, and lean protein.  Do not eat a lot of foods that are high in solid fats, added sugars, or sodium. Maintain a healthy weight Body mass index (BMI) is used to identify weight problems. It estimates body fat based on height and weight. Your health care provider can help determine your BMI and help you achieve or maintain a healthy weight. Get regular exercise Get regular exercise. This is one of the most important things you can do for your health. Most adults should:  Exercise for at least 150 minutes each week. The exercise should increase your heart rate and make you sweat (moderate-intensity exercise).  Do strengthening exercises at least twice a week. This is in addition to the moderate-intensity exercise.  Spend less time sitting. Even light physical activity can be beneficial. Watch cholesterol and blood lipids Have your blood tested for lipids and cholesterol at 83 years of age, then have this test every 5 years. Have your cholesterol levels checked more often if:  Your lipid or cholesterol levels are high.  You are older than 83 years of age.  You are at high risk for heart disease. What should I know about cancer screening? Depending on your health history and family history, you may need to have cancer screening at various ages. This may include screening for:  Breast cancer.  Cervical cancer.  Colorectal cancer.  Skin cancer.  Lung cancer. What should I know about heart  disease, diabetes, and high blood pressure? Blood pressure and heart disease  High blood pressure causes heart disease and increases the risk of stroke. This is more likely to develop in people who have high blood pressure readings, are of African descent, or are overweight.  Have your blood pressure checked: ? Every 3-5 years if you are 18-39 years of age. ? Every year if you are 40 years old or older. Diabetes Have regular diabetes screenings. This checks your fasting blood sugar level. Have the screening done:  Once every three years after age 40 if you are at a normal weight and have a low risk for diabetes.  More often and at a younger age if you are overweight or have a high risk for diabetes. What should I know about preventing infection? Hepatitis B If you have a higher risk for hepatitis B, you should be screened for this virus. Talk with your health care provider to find out if you are at risk for hepatitis B infection. Hepatitis C Testing is recommended for:  Everyone born from 1945 through 1965.  Anyone with known risk factors for hepatitis C. Sexually transmitted infections (STIs)  Get screened for STIs, including gonorrhea and chlamydia, if: ? You are sexually active and are younger than 83 years of age. ? You are older than 83 years of age and your health care provider tells you that you are at risk for this type of infection. ? Your sexual activity has changed since you were last screened, and you are at increased risk for   chlamydia or gonorrhea. Ask your health care provider if you are at risk.  Ask your health care provider about whether you are at high risk for HIV. Your health care provider may recommend a prescription medicine to help prevent HIV infection. If you choose to take medicine to prevent HIV, you should first get tested for HIV. You should then be tested every 3 months for as long as you are taking the medicine. Pregnancy  If you are about to stop  having your period (premenopausal) and you may become pregnant, seek counseling before you get pregnant.  Take 400 to 800 micrograms (mcg) of folic acid every day if you become pregnant.  Ask for birth control (contraception) if you want to prevent pregnancy. Osteoporosis and menopause Osteoporosis is a disease in which the bones lose minerals and strength with aging. This can result in bone fractures. If you are 65 years old or older, or if you are at risk for osteoporosis and fractures, ask your health care provider if you should:  Be screened for bone loss.  Take a calcium or vitamin D supplement to lower your risk of fractures.  Be given hormone replacement therapy (HRT) to treat symptoms of menopause. Follow these instructions at home: Lifestyle  Do not use any products that contain nicotine or tobacco, such as cigarettes, e-cigarettes, and chewing tobacco. If you need help quitting, ask your health care provider.  Do not use street drugs.  Do not share needles.  Ask your health care provider for help if you need support or information about quitting drugs. Alcohol use  Do not drink alcohol if: ? Your health care provider tells you not to drink. ? You are pregnant, may be pregnant, or are planning to become pregnant.  If you drink alcohol: ? Limit how much you use to 0-1 drink a day. ? Limit intake if you are breastfeeding.  Be aware of how much alcohol is in your drink. In the [place] one drink equals one 12 oz bottle of beer (355 mL), one 5 oz glass of wine (148 mL), or one 1 oz glass of hard liquor (44 mL). General instructions  Schedule regular health, dental, and eye exams.  Stay current with your vaccines.  Tell your health care provider if: ? You often feel depressed. ? You have ever been abused or do not feel safe at home. Summary  Adopting a healthy lifestyle and getting preventive care are important in promoting health and wellness.  Follow your health  care provider's instructions about healthy diet, exercising, and getting tested or screened for diseases.  Follow your health care provider's instructions on monitoring your cholesterol and blood pressure. This information is not intended to replace advice given to you by your health care provider. Make sure you discuss any questions you have with your health care provider. Document Released: 08/12/2010 Document Revised: 01/20/2018 Document Reviewed: 01/20/2018 Elsevier Patient Education  2020 [employer]  

## 2018-12-31 NOTE — Assessment & Plan Note (Signed)
Checking HgA1c and adjust as needed.  

## 2018-12-31 NOTE — Patient Instructions (Addendum)
Continue doing brain stimulating activities (puzzles, reading, adult coloring books, staying active) to keep memory sharp.   Continue to eat heart healthy diet (full of fruits, vegetables, whole grains, lean protein, water--limit salt, fat, and sugar intake) and increase physical activity as tolerated.   Melissa Warren , Thank you for taking time to come for your Medicare Wellness Visit. I appreciate your ongoing commitment to your health goals. Please review the following plan we discussed and let me know if I can assist you in the future.   These are the goals we discussed: Goals    . Patient Stated     Get better control over my diet by monitoring and doing healthy choices. Stay physically and socially active. Drink fluids often and try to get better sleep by adopting some of the recommended sleep tips.       This is a list of the screening recommended for you and due dates:  Health Maintenance  Topic Date Due  . Tetanus Vaccine  03/06/2021  . DEXA scan (bone density measurement)  Completed  . Pneumonia vaccines  Completed      Insomnia Insomnia is a sleep disorder that makes it difficult to fall asleep or stay asleep. Insomnia can cause fatigue, low energy, difficulty concentrating, mood swings, and poor performance at work or school. There are three different ways to classify insomnia:  Difficulty falling asleep.  Difficulty staying asleep.  Waking up too early in the morning. Any type of insomnia can be long-term (chronic) or short-term (acute). Both are common. Short-term insomnia usually lasts for three months or less. Chronic insomnia occurs at least three times a week for longer than three months. What are the causes? Insomnia may be caused by another condition, situation, or substance, such as:  Anxiety.  Certain medicines.  Gastroesophageal reflux disease (GERD) or other gastrointestinal conditions.  Asthma or other breathing conditions.  Restless legs syndrome,  sleep apnea, or other sleep disorders.  Chronic pain.  Menopause.  Stroke.  Abuse of alcohol, tobacco, or illegal drugs.  Mental health conditions, such as depression.  Caffeine.  Neurological disorders, such as Alzheimer's disease.  An overactive thyroid (hyperthyroidism). Sometimes, the cause of insomnia may not be known. What increases the risk? Risk factors for insomnia include:  Gender. Women are affected more often than men.  Age. Insomnia is more common as you get older.  Stress.  Lack of exercise.  Irregular work schedule or working night shifts.  Traveling between different time zones.  Certain medical and mental health conditions. What are the signs or symptoms? If you have insomnia, the main symptom is having trouble falling asleep or having trouble staying asleep. This may lead to other symptoms, such as:  Feeling fatigued or having low energy.  Feeling nervous about going to sleep.  Not feeling rested in the morning.  Having trouble concentrating.  Feeling irritable, anxious, or depressed. How is this diagnosed? This condition may be diagnosed based on:  Your symptoms and medical history. Your health care provider may ask about: ? Your sleep habits. ? Any medical conditions you have. ? Your mental health.  A physical exam. How is this treated? Treatment for insomnia depends on the cause. Treatment may focus on treating an underlying condition that is causing insomnia. Treatment may also include:  Medicines to help you sleep.  Counseling or therapy.  Lifestyle adjustments to help you sleep better. Follow these instructions at home: Eating and drinking   Limit or avoid alcohol, caffeinated beverages,  and cigarettes, especially close to bedtime. These can disrupt your sleep.  Do not eat a large meal or eat spicy foods right before bedtime. This can lead to digestive discomfort that can make it hard for you to sleep. Sleep habits    Keep a sleep diary to help you and your health care provider figure out what could be causing your insomnia. Write down: ? When you sleep. ? When you wake up during the night. ? How well you sleep. ? How rested you feel the next day. ? Any side effects of medicines you are taking. ? What you eat and drink.  Make your bedroom a dark, comfortable place where it is easy to fall asleep. ? Put up shades or blackout curtains to block light from outside. ? Use a white noise machine to block noise. ? Keep the temperature cool.  Limit screen use before bedtime. This includes: ? Watching TV. ? Using your smartphone, tablet, or computer.  Stick to a routine that includes going to bed and waking up at the same times every day and night. This can help you fall asleep faster. Consider making a quiet activity, such as reading, part of your nighttime routine.  Try to avoid taking naps during the day so that you sleep better at night.  Get out of bed if you are still awake after 15 minutes of trying to sleep. Keep the lights down, but try reading or doing a quiet activity. When you feel sleepy, go back to bed. General instructions  Take over-the-counter and prescription medicines only as told by your health care provider.  Exercise regularly, as told by your health care provider. Avoid exercise starting several hours before bedtime.  Use relaxation techniques to manage stress. Ask your health care provider to suggest some techniques that may work well for you. These may include: ? Breathing exercises. ? Routines to release muscle tension. ? Visualizing peaceful scenes.  Make sure that you drive carefully. Avoid driving if you feel very sleepy.  Keep all follow-up visits as told by your health care provider. This is important. Contact a health care provider if:  You are tired throughout the day.  You have trouble in your daily routine due to sleepiness.  You continue to have sleep problems,  or your sleep problems get worse. Get help right away if:  You have serious thoughts about hurting yourself or someone else. If you ever feel like you may hurt yourself or others, or have thoughts about taking your own life, get help right away. You can go to your nearest emergency department or call:  Your local emergency services (911 in the [place]  A suicide crisis helpline, such as the National Suicide Prevention Lifeline at (236) 275-4547. This is open 24 hours a day. Summary  Insomnia is a sleep disorder that makes it difficult to fall asleep or stay asleep.  Insomnia can be long-term (chronic) or short-term (acute).  Treatment for insomnia depends on the cause. Treatment may focus on treating an underlying condition that is causing insomnia.  Keep a sleep diary to help you and your health care provider figure out what could be causing your insomnia. This information is not intended to replace advice given to you by your health care provider. Make sure you discuss any questions you have with your health care provider. Document Released: 01/25/2000 Document Revised: 01/09/2017 Document Reviewed: 11/06/2016 Elsevier Patient Education  2020 [employer].

## 2018-12-31 NOTE — Assessment & Plan Note (Signed)
Checking CMP and adjust as needed losartan/hctz.

## 2018-12-31 NOTE — Progress Notes (Signed)
Medical screening examination/treatment/procedure(s) were performed by non-physician practitioner and as supervising physician I was immediately available for consultation/collaboration. I agree with above. Elizabeth A Crawford, MD 

## 2018-12-31 NOTE — Assessment & Plan Note (Signed)
Still following with eye specialist and doing well overall.

## 2019-01-03 ENCOUNTER — Telehealth: Payer: Self-pay | Admitting: Internal Medicine

## 2019-01-03 MED ORDER — LOSARTAN POTASSIUM-HCTZ 100-12.5 MG PO TABS: 1.0000 | ORAL_TABLET | Freq: Every day | ORAL | 3 refills | Status: DC

## 2019-01-03 NOTE — Telephone Encounter (Signed)
Pt is requesting a 90 day supply of losartan-hydrochlorothiazide (HYZAAR) 100-12.5 MG tablet    Pharmacy:  [place], [place] [phone] (Phone) [phone] (Fax

## 2019-01-03 NOTE — Telephone Encounter (Signed)
SENT 

## 2019-06-03 DIAGNOSIS — Z961 Presence of intraocular lens: Secondary | ICD-10-CM | POA: Diagnosis not present

## 2019-06-03 DIAGNOSIS — H401132 Primary open-angle glaucoma, bilateral, moderate stage: Secondary | ICD-10-CM | POA: Diagnosis not present

## 2019-07-14 DIAGNOSIS — Z79899 Other long term (current) drug therapy: Secondary | ICD-10-CM | POA: Diagnosis not present

## 2019-07-14 DIAGNOSIS — Z87891 Personal history of nicotine dependence: Secondary | ICD-10-CM | POA: Diagnosis not present

## 2019-07-14 DIAGNOSIS — K219 Gastro-esophageal reflux disease without esophagitis: Secondary | ICD-10-CM | POA: Diagnosis not present

## 2019-07-14 DIAGNOSIS — M1990 Unspecified osteoarthritis, unspecified site: Secondary | ICD-10-CM | POA: Diagnosis not present

## 2019-07-14 DIAGNOSIS — H401112 Primary open-angle glaucoma, right eye, moderate stage: Secondary | ICD-10-CM | POA: Diagnosis not present

## 2019-07-14 DIAGNOSIS — Z888 Allergy status to other drugs, medicaments and biological substances status: Secondary | ICD-10-CM | POA: Diagnosis not present

## 2019-07-25 ENCOUNTER — Ambulatory Visit (INDEPENDENT_AMBULATORY_CARE_PROVIDER_SITE_OTHER): Payer: Medicare Other | Admitting: Internal Medicine

## 2019-07-25 ENCOUNTER — Encounter: Payer: Self-pay | Admitting: Internal Medicine

## 2019-07-25 ENCOUNTER — Telehealth: Payer: Self-pay

## 2019-07-25 ENCOUNTER — Other Ambulatory Visit: Payer: Self-pay

## 2019-07-25 VITALS — BP 122/82 | HR 82 | Temp 98.1°F | Ht 62.0 in | Wt 162.0 lb

## 2019-07-25 DIAGNOSIS — R7303 Prediabetes: Secondary | ICD-10-CM | POA: Diagnosis not present

## 2019-07-25 DIAGNOSIS — M7989 Other specified soft tissue disorders: Secondary | ICD-10-CM

## 2019-07-25 DIAGNOSIS — R739 Hyperglycemia, unspecified: Secondary | ICD-10-CM | POA: Diagnosis not present

## 2019-07-25 DIAGNOSIS — R0602 Shortness of breath: Secondary | ICD-10-CM

## 2019-07-25 LAB — CBC
HCT: 36.4 % (ref 36.0–46.0)
Hemoglobin: 12.5 g/dL (ref 12.0–15.0)
MCHC: 34.3 g/dL (ref 30.0–36.0)
MCV: 91.5 fl (ref 78.0–100.0)
Platelets: 265 10*3/uL (ref 150.0–400.0)
RBC: 3.98 Mil/uL (ref 3.87–5.11)
RDW: 12.8 % (ref 11.5–15.5)
WBC: 6.7 10*3/uL (ref 4.0–10.5)

## 2019-07-25 LAB — BRAIN NATRIURETIC PEPTIDE: Pro B Natriuretic peptide (BNP): 77 pg/mL (ref 0.0–100.0)

## 2019-07-25 LAB — HEMOGLOBIN A1C: Hgb A1c MFr Bld: 6.7 % — ABNORMAL HIGH (ref 4.6–6.5)

## 2019-07-25 NOTE — Patient Instructions (Addendum)
We will check the labs today.  DASH Eating Plan DASH stands for "Dietary Approaches to Stop Hypertension." The DASH eating plan is a healthy eating plan that has been shown to reduce high blood pressure (hypertension). It may also reduce your risk for type 2 diabetes, heart disease, and stroke. The DASH eating plan may also help with weight loss. What are tips for following this plan?  General guidelines  Avoid eating more than 2,300 mg (milligrams) of salt (sodium) a day. If you have hypertension, you may need to reduce your sodium intake to 1,500 mg a day.  Limit alcohol intake to no more than 1 drink a day for nonpregnant women and 2 drinks a day for men. One drink equals 12 oz of beer, 5 oz of wine, or 1 oz of hard liquor.  Work with your health care provider to maintain a healthy body weight or to lose weight. Ask what an ideal weight is for you.  Get at least 30 minutes of exercise that causes your heart to beat faster (aerobic exercise) most days of the week. Activities may include walking, swimming, or biking.  Work with your health care provider or diet and nutrition specialist (dietitian) to adjust your eating plan to your individual calorie needs. Reading food labels   Check food labels for the amount of sodium per serving. Choose foods with less than 5 percent of the Daily Value of sodium. Generally, foods with less than 300 mg of sodium per serving fit into this eating plan.  To find whole grains, look for the word "whole" as the first word in the ingredient list. Shopping  Buy products labeled as "low-sodium" or "no salt added."  Buy fresh foods. Avoid canned foods and premade or frozen meals. Cooking  Avoid adding salt when cooking. Use salt-free seasonings or herbs instead of table salt or sea salt. Check with your health care provider or pharmacist before using salt substitutes.  Do not fry foods. Cook foods using healthy methods such as baking, boiling, grilling,  and broiling instead.  Cook with heart-healthy oils, such as olive, canola, soybean, or sunflower oil. Meal planning  Eat a balanced diet that includes: ? 5 or more servings of fruits and vegetables each day. At each meal, try to fill half of your plate with fruits and vegetables. ? Up to 6-8 servings of whole grains each day. ? Less than 6 oz of lean meat, poultry, or fish each day. A 3-oz serving of meat is about the same size as a deck of cards. One egg equals 1 oz. ? 2 servings of low-fat dairy each day. ? A serving of nuts, seeds, or beans 5 times each week. ? Heart-healthy fats. Healthy fats called Omega-3 fatty acids are found in foods such as flaxseeds and coldwater fish, like sardines, salmon, and mackerel.  Limit how much you eat of the following: ? Canned or prepackaged foods. ? Food that is high in trans fat, such as fried foods. ? Food that is high in saturated fat, such as fatty meat. ? Sweets, desserts, sugary drinks, and other foods with added sugar. ? Full-fat dairy products.  Do not salt foods before eating.  Try to eat at least 2 vegetarian meals each week.  Eat more home-cooked food and less restaurant, buffet, and fast food.  When eating at a restaurant, ask that your food be prepared with less salt or no salt, if possible. What foods are recommended? The items listed may not be a  complete list. Talk with your dietitian about what dietary choices are best for you. Grains Whole-grain or whole-wheat bread. Whole-grain or whole-wheat pasta. Brown rice. [place]row. Bulgur. Whole-grain and low-sodium cereals. Pita bread. Low-fat, low-sodium crackers. Whole-wheat flour tortillas. Vegetables Fresh or frozen vegetables (raw, steamed, roasted, or grilled). Low-sodium or reduced-sodium tomato and vegetable juice. Low-sodium or reduced-sodium tomato sauce and tomato paste. Low-sodium or reduced-sodium canned vegetables. Fruits All fresh, dried, or frozen fruit. Canned  fruit in natural juice (without added sugar). Meat and other protein foods Skinless chicken or [place]. Ground chicken or [place]. Pork with fat trimmed off. Fish and seafood. Egg whites. Dried beans, peas, or lentils. Unsalted nuts, nut butters, and seeds. Unsalted canned beans. Lean cuts of beef with fat trimmed off. Low-sodium, lean deli meat. Dairy Low-fat (1%) or fat-free (skim) milk. Fat-free, low-fat, or reduced-fat cheeses. Nonfat, low-sodium ricotta or cottage cheese. Low-fat or nonfat yogurt. Low-fat, low-sodium cheese. Fats and oils Soft margarine without trans fats. Vegetable oil. Low-fat, reduced-fat, or light mayonnaise and salad dressings (reduced-sodium). Canola, safflower, olive, soybean, and sunflower oils. Avocado. Seasoning and other foods Herbs. Spices. Seasoning mixes without salt. Unsalted popcorn and pretzels. Fat-free sweets. What foods are not recommended? The items listed may not be a complete list. Talk with your dietitian about what dietary choices are best for you. Grains Baked goods made with fat, such as croissants, muffins, or some breads. Dry pasta or rice meal packs. Vegetables Creamed or fried vegetables. Vegetables in a cheese sauce. Regular canned vegetables (not low-sodium or reduced-sodium). Regular canned tomato sauce and paste (not low-sodium or reduced-sodium). Regular tomato and vegetable juice (not low-sodium or reduced-sodium). Angie Fava. Olives. Fruits Canned fruit in a light or heavy syrup. Fried fruit. Fruit in cream or butter sauce. Meat and other protein foods Fatty cuts of meat. Ribs. Fried meat. Berniece Salines. Sausage. Bologna and other processed lunch meats. Salami. Fatback. Hotdogs. Bratwurst. Salted nuts and seeds. Canned beans with added salt. Canned or smoked fish. Whole eggs or egg yolks. Chicken or [place] with skin. Dairy Whole or 2% milk, cream, and half-and-half. Whole or full-fat cream cheese. Whole-fat or sweetened yogurt. Full-fat cheese.  Nondairy creamers. Whipped toppings. Processed cheese and cheese spreads. Fats and oils Butter. Stick margarine. Lard. Shortening. Ghee. Bacon fat. Tropical oils, such as coconut, palm kernel, or palm oil. Seasoning and other foods Salted popcorn and pretzels. Onion salt, garlic salt, seasoned salt, table salt, and sea salt. Worcestershire sauce. Tartar sauce. Barbecue sauce. Teriyaki sauce. Soy sauce, including reduced-sodium. Steak sauce. Canned and packaged gravies. Fish sauce. Oyster sauce. Cocktail sauce. Horseradish that you find on the shelf. Ketchup. Mustard. Meat flavorings and tenderizers. Bouillon cubes. Hot sauce and Tabasco sauce. Premade or packaged marinades. Premade or packaged taco seasonings. Relishes. Regular salad dressings. Where to find more information:  National Heart, Lung, and [place]: https://wilson-eaton.com/  American Heart Association: www.heart.org Summary  The DASH eating plan is a healthy eating plan that has been shown to reduce high blood pressure (hypertension). It may also reduce your risk for type 2 diabetes, heart disease, and stroke.  With the DASH eating plan, you should limit salt (sodium) intake to 2,300 mg a day. If you have hypertension, you may need to reduce your sodium intake to 1,500 mg a day.  When on the DASH eating plan, aim to eat more fresh fruits and vegetables, whole grains, lean proteins, low-fat dairy, and heart-healthy fats.  Work with your health care provider or diet and nutrition specialist (dietitian)  to adjust your eating plan to your individual calorie needs. This information is not intended to replace advice given to you by your health care provider. Make sure you discuss any questions you have with your health care provider. Document Revised: 01/09/2017 Document Reviewed: 01/21/2016 Elsevier Patient Education  2020 [employer].

## 2019-07-25 NOTE — Progress Notes (Signed)
   Subjective:   Patient ID: Melissa Warren, female    DOB: [birth year], 86 y.o.   MRN: [id number]  HPI The patient is an 86 YO female coming in for swelling in her ankles. Started about 2 weeks ago around [place] day. She has changed her diet recently and it is not as healthy now. She denies change in medications although she did have eye procedure last week and is currently taking several different eye drops (does not recall names or have with her). Denies pain in the legs but gets cramping in the evening. Legs are fairly small in the morning and swell throughout the day. Denies fevers or chills. Denies rash on the legs. Denies SOB with exertion or lying flat.   Review of Systems  Constitutional: Negative.   HENT: Negative.   Eyes: Negative.   Respiratory: Negative for cough, chest tightness and shortness of breath.   Cardiovascular: Positive for leg swelling. Negative for chest pain and palpitations.  Gastrointestinal: Negative for abdominal distention, abdominal pain, constipation, diarrhea, nausea and vomiting.  Musculoskeletal: Negative.   Skin: Negative.   Neurological: Negative.   Psychiatric/Behavioral: Negative.     Objective:  Physical Exam Constitutional:      Appearance: She is well-developed.  HENT:     Head: Normocephalic and atraumatic.  Cardiovascular:     Rate and Rhythm: Normal rate and regular rhythm.  Pulmonary:     Effort: Pulmonary effort is normal. No respiratory distress.     Breath sounds: Normal breath sounds. No wheezing or rales.  Abdominal:     General: Bowel sounds are normal. There is no distension.     Palpations: Abdomen is soft.     Tenderness: There is no abdominal tenderness. There is no rebound.  Musculoskeletal:     Cervical back: Normal range of motion.     Comments: 1+ pitting edema to mid-shins bilaterally  Skin:    General: Skin is warm and dry.  Neurological:     Mental Status: She is alert and oriented to person, place, and time.      Coordination: Coordination normal.     Vitals:   07/25/19 1550  BP: 122/82  Pulse: 82  Temp: 98.1 F (36.7 C)  TempSrc: Oral  SpO2: 96%  Weight: 162 lb (73.5 kg)  Height: 5\' 2"  (1.575 m)    This visit occurred during the SARS-CoV-2 public health emergency.  Safety protocols were in place, including screening questions prior to the visit, additional usage of staff PPE, and extensive cleaning of exam room while observing appropriate contact time as indicated for disinfecting solutions.   Assessment & Plan:

## 2019-07-25 NOTE — Telephone Encounter (Signed)
New message   The patient voiced   C/o swelling in ankles, feet and frequent Urination  Blood pressure issues 143/82 today.   The patient is aware the call will be transferred to [facility] Triage for assessment  Call / spoke with Clifton Custard.

## 2019-07-26 DIAGNOSIS — M7989 Other specified soft tissue disorders: Secondary | ICD-10-CM | POA: Insufficient documentation

## 2019-07-26 LAB — COMPREHENSIVE METABOLIC PANEL
ALT: 13 U/L (ref 0–35)
AST: 19 U/L (ref 0–37)
Albumin: 4.2 g/dL (ref 3.5–5.2)
Alkaline Phosphatase: 60 U/L (ref 39–117)
BUN: 18 mg/dL (ref 6–23)
CO2: 27 mEq/L (ref 19–32)
Calcium: 10 mg/dL (ref 8.4–10.5)
Chloride: 105 mEq/L (ref 96–112)
Creatinine, Ser: 1.26 mg/dL — ABNORMAL HIGH (ref 0.40–1.20)
GFR: 48.91 mL/min — ABNORMAL LOW (ref >60.00)
Glucose, Bld: 95 mg/dL (ref 70–99)
Potassium: 4 mEq/L (ref 3.5–5.1)
Sodium: 139 mEq/L (ref 135–145)
Total Bilirubin: 0.6 mg/dL (ref 0.2–1.2)
Total Protein: 7 g/dL (ref 6.0–8.3)

## 2019-07-26 NOTE — Assessment & Plan Note (Signed)
Checking HgA1c. 

## 2019-07-26 NOTE — Assessment & Plan Note (Signed)
Suspect venous insufficiency caused by increase in sodium in diet. Advised to increase water, decrease sodium, elevate legs when possible. Checking CBC, CMP, BNP to rule out alternate etiology.

## 2019-11-07 ENCOUNTER — Other Ambulatory Visit: Payer: Self-pay | Admitting: Family

## 2019-11-07 ENCOUNTER — Other Ambulatory Visit: Payer: Self-pay

## 2019-11-07 ENCOUNTER — Ambulatory Visit (INDEPENDENT_AMBULATORY_CARE_PROVIDER_SITE_OTHER): Payer: Medicare Other | Admitting: Family

## 2019-11-07 VITALS — BP 132/70 | HR 79 | Temp 98.3°F | Ht 62.0 in | Wt 162.0 lb

## 2019-11-07 DIAGNOSIS — N644 Mastodynia: Secondary | ICD-10-CM

## 2019-11-07 DIAGNOSIS — R0789 Other chest pain: Secondary | ICD-10-CM

## 2019-11-07 DIAGNOSIS — L989 Disorder of the skin and subcutaneous tissue, unspecified: Secondary | ICD-10-CM | POA: Diagnosis not present

## 2019-11-07 NOTE — Progress Notes (Signed)
Melissa Warren is a 86 y.o. female with the following history as recorded in Epic[facility]:  Patient Active Problem List   Diagnosis Date Noted  . Leg swelling 07/26/2019  . GERD (gastroesophageal reflux disease) 12/22/2017  . Routine general medical examination at a health care facility 09/18/2015  . Pseudoaphakia 06/01/2013  . Hypertension 03/31/2013  . Chronic glaucoma 03/04/2013  . Hyperglycemia 03/09/2011    Current Outpatient Medications  Medication Sig Dispense Refill  . bimatoprost (LUMIGAN) 0.03 % ophthalmic solution Place 1 drop into the right eye at bedtime.     . cholecalciferol (VITAMIN D) 1000 UNITS tablet Take 1 tablet (1,000 Units total) by mouth daily.    . dorzolamide-timolol (COSOPT) 22.3-6.8 MG/ML ophthalmic solution Place 1 drop into both eyes 2 (two) times daily.     Marland Kitchen losartan-hydrochlorothiazide (HYZAAR) 100-12.5 MG tablet Take 1 tablet by mouth daily. 90 tablet 3  . Multiple Vitamins-Minerals (MULTIVITAMIN) tablet Take 1 tablet by mouth daily.    . vitamin C (ASCORBIC ACID) 500 MG tablet Take 1 tablet (500 mg total) by mouth 2 (two) times daily.    Marland Kitchen LUMIGAN 0.01 % SOLN     . RHOPRESSA 0.02 % SOLN  (Patient not taking: Reported on 07/25/2019)     No current facility-administered medications for this visit.    Allergies: Brimonidine  Past Medical History:  Diagnosis Date  . Blood transfusion 1977   after hysterectomy  . Cataract   . Constipation   . Glaucoma    [facility]" B in [facility]  . Hypertension     Past Surgical History:  Procedure Laterality Date  . ABDOMINAL HYSTERECTOMY  1977  . CATARACT EXTRACTION  2014 and 2015   [facility]  . CESAREAN SECTION  1965  . GLAUCOMA SURGERY  2010/2012   Laser bilaterally and micro shunt implant in right eye  . GLAUCOMA VALVE INSERTION  12/2011   dr frank moya, right eye  . REFRACTIVE SURGERY Left 12/2018    Family History  Problem Relation Age of Onset  . Colon cancer Sister 76  . Prostate cancer Paternal  Grandfather   . Hypertension Maternal Grandmother   . Diabetes Maternal Grandmother   . Diabetes Mother   . Diabetes Brother   . Diabetes Brother   . Diabetes Brother   . Diabetes Sister   . Diabetes Sister   . Diabetes Sister   . Hypertension Sister   . Hypertension Brother   . Stroke Sister   . Kidney failure Other     Social History   Tobacco Use  . Smoking status: Former Smoker    Quit date: 02/11/1975    Years since quitting: 44.7  . Smokeless tobacco: Never Used  Substance Use Topics  . Alcohol use: No    Alcohol/week: 0.0 standard drinks    Subjective:  Patient had been having some "burning" sensation in her upper left chest "on and off" for the past 2 weeks; admits that symptoms were most present when lying down at night; no chest pain on exertion; does have history of acid reflux but does not take prescriptive medication; last week drank water/ baking soda and symptoms resolved- has not been present for the past week;     Objective:  Vitals:   11/07/19 1302  BP: 132/70  Pulse: 79  Temp: 98.3 F (36.8 C)  TempSrc: Oral  SpO2: 98%  Weight: 162 lb (73.5 kg)  Height: 5\' 2"  (1.575 m)    General: Well developed,  well nourished, in no acute distress  Skin : Warm and dry.  Head: Normocephalic and atraumatic  Lungs: Respirations unlabored; clear to auscultation bilaterally without wheeze, rales, rhonchi  CVS exam: normal rate and regular rhythm.  Neurologic: Alert and oriented; speech intact; face symmetrical; moves all extremities well; CNII-XII intact without focal deficit  Breast exam- normal bilaterally;   Assessment:  1. Atypical chest pain   2. Non-healing skin lesion   3. Breast pain     Plan:  Suspect GERD symptoms since patient has been pain free since taking OTC reflux treatments; EKG shows no acute changes/ NSR; breast exam is unremarkable but will update mammogram due to location of pain; follow-up to be determined;  Will also refer to dermatology  about non-healing lesions on her scalp;  Patient defers flu shot today;  This visit occurred during the SARS-CoV-2 public health emergency.  Safety protocols were in place, including screening questions prior to the visit, additional usage of staff PPE, and extensive cleaning of exam room while observing appropriate contact time as indicated for disinfecting solutions.     No follow-ups on file.  Orders Placed This Encounter  Procedures  . MM Digital Diagnostic Bilat    Standing Status:   Future    Standing Expiration Date:   11/06/2020    Order Specific Question:   Reason for Exam (SYMPTOM  OR DIAGNOSIS REQUIRED)    Answer:   left breast pain    Order Specific Question:   Preferred imaging location?    Answer:   [facility]  . Ambulatory referral to Dermatology    Referral Priority:   Routine    Referral Type:   Consultation    Referral Reason:   Specialty Services Required    Requested Specialty:   Dermatology    Number of Visits Requested:   1    Requested Prescriptions    No prescriptions requested or ordered in this encounter

## 2019-11-10 ENCOUNTER — Other Ambulatory Visit: Payer: Self-pay | Admitting: Family

## 2019-11-10 DIAGNOSIS — N644 Mastodynia: Secondary | ICD-10-CM

## 2019-11-28 ENCOUNTER — Ambulatory Visit
Admission: RE | Admit: 2019-11-28 | Discharge: 2019-11-28 | Disposition: A | Discharge status: Home / Self Care | Payer: Medicare Other | Source: Ambulatory Visit | Attending: Family | Admitting: Family

## 2019-11-28 ENCOUNTER — Other Ambulatory Visit: Payer: Self-pay

## 2019-11-28 DIAGNOSIS — N6321 Unspecified lump in the left breast, upper outer quadrant: Secondary | ICD-10-CM | POA: Diagnosis not present

## 2019-11-28 DIAGNOSIS — N644 Mastodynia: Secondary | ICD-10-CM

## 2019-12-20 DIAGNOSIS — L309 Dermatitis, unspecified: Secondary | ICD-10-CM | POA: Diagnosis not present

## 2019-12-20 DIAGNOSIS — L821 Other seborrheic keratosis: Secondary | ICD-10-CM | POA: Diagnosis not present

## 2019-12-20 DIAGNOSIS — B351 Tinea unguium: Secondary | ICD-10-CM | POA: Diagnosis not present

## 2020-01-02 ENCOUNTER — Ambulatory Visit (INDEPENDENT_AMBULATORY_CARE_PROVIDER_SITE_OTHER): Payer: Medicare Other

## 2020-01-02 DIAGNOSIS — Z0000 Encounter for general adult medical examination without abnormal findings: Secondary | ICD-10-CM | POA: Diagnosis not present

## 2020-01-02 NOTE — Patient Instructions (Signed)
Melissa Warren , Thank you for taking time to come for your Medicare Wellness Visit. I appreciate your ongoing commitment to your health goals. Please review the following plan we discussed and let me know if I can assist you in the future.   Screening recommendations/referrals: Colonoscopy: no repeat due to age Mammogram: 11/28/2019 Bone Density: 09/06/2014 Recommended yearly ophthalmology/optometry visit for glaucoma screening and checkup Recommended yearly dental visit for hygiene and checkup  Vaccinations: Influenza vaccine: declined Pneumococcal vaccine: up to date Tdap vaccine: declined Shingles vaccine: never done   Covid-19: up to date  Advanced directives: Please bring a copy of your health care power of attorney and living will to the office at your convenience.  Conditions/risks identified: Yes. Reviewed health maintenance screenings with patient today and relevant education, vaccines, and/or referrals were provided. Continue doing brain stimulating activities (puzzles, reading, adult coloring books, staying active) to keep memory sharp. Continue to eat heart healthy diet (full of fruits, vegetables, whole grains, lean protein, water--limit salt, fat, and sugar intake) and increase physical activity as tolerated.  Next appointment: Please schedule your next Medicare Wellness Visit with your Nurse Health Advisor in 1 year by calling [phone].   Preventive Care 31 Years and Older, Female Preventive care refers to lifestyle choices and visits with your health care provider that can promote health and wellness. What does preventive care include?  A yearly physical exam. This is also called an annual well check.  Dental exams once or twice a year.  Routine eye exams. Ask your health care provider how often you should have your eyes checked.  Personal lifestyle choices, including:  Daily care of your teeth and gums.  Regular physical activity.  Eating a healthy  diet.  Avoiding tobacco and drug use.  Limiting alcohol use.  Practicing safe sex.  Taking low-dose aspirin every day.  Taking vitamin and mineral supplements as recommended by your health care provider. What happens during an annual well check? The services and screenings done by your health care provider during your annual well check will depend on your age, overall health, lifestyle risk factors, and family history of disease. Counseling  Your health care provider may ask you questions about your:  Alcohol use.  Tobacco use.  Drug use.  Emotional well-being.  Home and relationship well-being.  Sexual activity.  Eating habits.  History of falls.  Memory and ability to understand (cognition).  Work and work Astronomer.  Reproductive health. Screening  You may have the following tests or measurements:  Height, weight, and BMI.  Blood pressure.  Lipid and cholesterol levels. These may be checked every 5 years, or more frequently if you are over 26 years old.  Skin check.  Lung cancer screening. You may have this screening every year starting at age 45 if you have a 30-pack-year history of smoking and currently smoke or have quit within the past 15 years.  Fecal occult blood test (FOBT) of the stool. You may have this test every year starting at age 35.  Flexible sigmoidoscopy or colonoscopy. You may have a sigmoidoscopy every 5 years or a colonoscopy every 10 years starting at age 38.  Hepatitis C blood test.  Hepatitis B blood test.  Sexually transmitted disease (STD) testing.  Diabetes screening. This is done by checking your blood sugar (glucose) after you have not eaten for a while (fasting). You may have this done every 1-3 years.  Bone density scan. This is done to screen for osteoporosis. You may have  this done starting at age 48.  Mammogram. This may be done every 1-2 years. Talk to your health care provider about how often you should have  regular mammograms. Talk with your health care provider about your test results, treatment options, and if necessary, the need for more tests. Vaccines  Your health care provider may recommend certain vaccines, such as:  Influenza vaccine. This is recommended every year.  Tetanus, diphtheria, and acellular pertussis (Tdap, Td) vaccine. You may need a Td booster every 10 years.  Zoster vaccine. You may need this after age 51.  Pneumococcal 13-valent conjugate (PCV13) vaccine. One dose is recommended after age 43.  Pneumococcal polysaccharide (PPSV23) vaccine. One dose is recommended after age 69. Talk to your health care provider about which screenings and vaccines you need and how often you need them. This information is not intended to replace advice given to you by your health care provider. Make sure you discuss any questions you have with your health care provider. Document Released: 02/23/2015 Document Revised: 10/17/2015 Document Reviewed: 11/28/2014 Elsevier Interactive Patient Education  2017 [employer].  Fall Prevention in the Home Falls can cause injuries. They can happen to people of all ages. There are many things you can do to make your home safe and to help prevent falls. What can I do on the outside of my home?  Regularly fix the edges of walkways and driveways and fix any cracks.  Remove anything that might make you trip as you walk through a door, such as a raised step or threshold.  Trim any bushes or trees on the path to your home.  Use bright outdoor lighting.  Clear any walking paths of anything that might make someone trip, such as rocks or tools.  Regularly check to see if handrails are loose or broken. Make sure that both sides of any steps have handrails.  Any raised decks and porches should have guardrails on the edges.  Have any leaves, snow, or ice cleared regularly.  Use sand or salt on walking paths during winter.  Clean up any spills in your  garage right away. This includes oil or grease spills. What can I do in the bathroom?  Use night lights.  Install grab bars by the toilet and in the tub and shower. Do not use towel bars as grab bars.  Use non-skid mats or decals in the tub or shower.  If you need to sit down in the shower, use a plastic, non-slip stool.  Keep the floor dry. Clean up any water that spills on the floor as soon as it happens.  Remove soap buildup in the tub or shower regularly.  Attach bath mats securely with double-sided non-slip rug tape.  Do not have throw rugs and other things on the floor that can make you trip. What can I do in the bedroom?  Use night lights.  Make sure that you have a light by your bed that is easy to reach.  Do not use any sheets or blankets that are too big for your bed. They should not hang down onto the floor.  Have a firm chair that has side arms. You can use this for support while you get dressed.  Do not have throw rugs and other things on the floor that can make you trip. What can I do in the kitchen?  Clean up any spills right away.  Avoid walking on wet floors.  Keep items that you use a lot in easy-to-reach  places.  If you need to reach something above you, use a strong step stool that has a grab bar.  Keep electrical cords out of the way.  Do not use floor polish or wax that makes floors slippery. If you must use wax, use non-skid floor wax.  Do not have throw rugs and other things on the floor that can make you trip. What can I do with my stairs?  Do not leave any items on the stairs.  Make sure that there are handrails on both sides of the stairs and use them. Fix handrails that are broken or loose. Make sure that handrails are as long as the stairways.  Check any carpeting to make sure that it is firmly attached to the stairs. Fix any carpet that is loose or worn.  Avoid having throw rugs at the top or bottom of the stairs. If you do have throw  rugs, attach them to the floor with carpet tape.  Make sure that you have a light switch at the top of the stairs and the bottom of the stairs. If you do not have them, ask someone to add them for you. What else can I do to help prevent falls?  Wear shoes that:  Do not have high heels.  Have rubber bottoms.  Are comfortable and fit you well.  Are closed at the toe. Do not wear sandals.  If you use a stepladder:  Make sure that it is fully opened. Do not climb a closed stepladder.  Make sure that both sides of the stepladder are locked into place.  Ask someone to hold it for you, if possible.  Clearly mark and make sure that you can see:  Any grab bars or handrails.  First and last steps.  Where the edge of each step is.  Use tools that help you move around (mobility aids) if they are needed. These include:  Canes.  Walkers.  Scooters.  Crutches.  Turn on the lights when you go into a dark area. Replace any light bulbs as soon as they burn out.  Set up your furniture so you have a clear path. Avoid moving your furniture around.  If any of your floors are uneven, fix them.  If there are any pets around you, be aware of where they are.  Review your medicines with your doctor. Some medicines can make you feel dizzy. This can increase your chance of falling. Ask your doctor what other things that you can do to help prevent falls. This information is not intended to replace advice given to you by your health care provider. Make sure you discuss any questions you have with your health care provider. Document Released: 11/23/2008 Document Revised: 07/05/2015 Document Reviewed: 03/03/2014 Elsevier Interactive Patient Education  2017 [employer].

## 2020-01-02 NOTE — Progress Notes (Signed)
I connected with Calley Drenning today by telephone and verified that I am speaking with the correct person using two identifiers. Location patient: home Location provider: work Persons participating in the virtual visit: Melissa Warren and Cecilia, LPN.   I discussed the limitations, risks, security and privacy concerns of performing an evaluation and management service by telephone and the availability of in person appointments. I also discussed with the patient that there may be a patient responsible charge related to this service. The patient expressed understanding and verbally consented to this telephonic visit.    Interactive audio and video telecommunications were attempted between this provider and patient, however failed, due to patient having technical difficulties OR patient did not have access to video capability.  We continued and completed visit with audio only.  Some vital signs may be absent or patient reported.   Time Spent with patient on telephone encounter: 30 minutes  Subjective:   Melissa Warren is a 86 y.o. female who presents for Medicare Annual (Subsequent) preventive examination.  Review of Systems    No ROS. Medicare Wellness Visit. Additional risk factors are reflected in social history. Cardiac Risk Factors include: advanced age (>59men, >52 women);family history of premature cardiovascular disease;hypertension     Objective:    There were no vitals filed for this visit. There is no height or weight on file to calculate BMI.  Advanced Directives 01/02/2020 12/31/2018 12/22/2017 12/18/2016  Does Patient Have a Medical Advance Directive? Yes Yes Yes Yes  Type of Advance Directive Living will;Healthcare Power of Attorney Living will Healthcare Power of [place];Living will Healthcare Power of [place];Living will  Copy of Healthcare Power of Attorney in Chart? No - copy requested - No - copy requested -    Current Medications (verified) Outpatient Encounter  Medications as of 01/02/2020  Medication Sig  . bimatoprost (LUMIGAN) 0.03 % ophthalmic solution Place 1 drop into both eyes at bedtime.   . cholecalciferol (VITAMIN D) 1000 UNITS tablet Take 1 tablet (1,000 Units total) by mouth daily.  . dorzolamide-timolol (COSOPT) 22.3-6.8 MG/ML ophthalmic solution Place 1 drop into the left eye 2 (two) times daily.   Marland Kitchen losartan-hydrochlorothiazide (HYZAAR) 100-12.5 MG tablet Take 1 tablet by mouth daily.  . Multiple Vitamins-Minerals (MULTIVITAMIN) tablet Take 1 tablet by mouth daily.  . vitamin C (ASCORBIC ACID) 500 MG tablet Take 1 tablet (500 mg total) by mouth 2 (two) times daily.  Marland Kitchen LUMIGAN 0.01 % SOLN   . RHOPRESSA 0.02 % SOLN  (Patient not taking: Reported on 07/25/2019)   No facility-administered encounter medications on file as of 01/02/2020.    Allergies (verified) Brimonidine   History: Past Medical History:  Diagnosis Date  . Blood transfusion 1977   after hysterectomy  . Cataract   . Constipation   . Glaucoma    surgery "[facility]" B in 2014 [facility]  . Hypertension    Past Surgical History:  Procedure Laterality Date  . ABDOMINAL HYSTERECTOMY  1977  . CATARACT EXTRACTION  2014 and 2015   [facility]  . CESAREAN SECTION  1965  . GLAUCOMA SURGERY  2010/2012   Laser bilaterally and micro shunt implant in right eye  . GLAUCOMA VALVE INSERTION  12/2011   dr frank moya, right eye  . REFRACTIVE SURGERY Left 12/2018   Family History  Problem Relation Age of Onset  . Colon cancer Sister 83  . Prostate cancer Paternal Grandfather   . Hypertension Maternal Grandmother   . Diabetes Maternal Grandmother   .  Diabetes Mother   . Diabetes Brother   . Diabetes Brother   . Diabetes Brother   . Diabetes Sister   . Diabetes Sister   . Diabetes Sister   . Hypertension Sister   . Hypertension Brother   . Stroke Sister   . Kidney failure Other    Social History   Socioeconomic History  . Marital status: Widowed    Spouse name: Not on  file  . Number of children: 1  . Years of education: Not on file  . Highest education level: Not on file  Occupational History  . Occupation: Reitred  Tobacco Use  . Smoking status: Former Smoker    Quit date: 02/11/1975    Years since quitting: 44.9  . Smokeless tobacco: Never Used  Vaping Use  . Vaping Use: Never used  Substance and Sexual Activity  . Alcohol use: No    Alcohol/week: 0.0 standard drinks  . Drug use: No  . Sexual activity: Not Currently  Other Topics Concern  . 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 Difficulty of Paying Living Expenses: Not hard at all  Food Insecurity: No Food Insecurity  . Worried About Programme researcher, broadcasting/film/video in the Last Year: Never true  . Ran Out of Food in the Last Year: Never true  Transportation Needs: No Transportation Needs  . Lack of Transportation (Medical): No  . Lack of Transportation (Non-Medical): No  Physical Activity: Inactive  . Days of Exercise per Week: 0 days  . Minutes of Exercise per Session: 0 min  Stress: No Stress Concern Present  . Feeling of Stress : Not at all  Social Connections: Socially Isolated  . Frequency of Communication with Friends and Family: More than three times a week  . Frequency of Social Gatherings with Friends and Family: Once a week  . Attends Religious Services: Never  . Active Member of Clubs or Organizations: No  . Attends Banker Meetings: Never  . Marital Status: Widowed    Tobacco Counseling Counseling given: Not Answered   Clinical Intake:  Pre-visit preparation completed: Yes  Pain : No/denies pain     Nutritional Risks: None Diabetes: No  How often do you need to have someone help you when you read instructions, pamphlets, or other written materials from your doctor or pharmacy?: 1 -  Never What is the last grade level you completed in school?: Bachelor's Degree  Diabetic? no  Interpreter Needed?: No  Information entered by :: Susie Cassette, LPN   Activities of Daily Living In your present state of health, do you have any difficulty performing the following activities: 01/02/2020  Hearing? Y  Comment WEARS HEARING AIDS  Vision? Y  Comment glaucoma  Difficulty concentrating or making decisions? N  Walking or climbing stairs? N  Dressing or bathing? N  Doing errands, shopping? 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 N  In the past six months, have you accidently leaked urine? Y  Do you have problems with loss of bowel control? N  Managing your Medications? N  Managing your Finances? N  Housekeeping or managing your Housekeeping? N  Some recent data might be hidden    Patient Care Team: Myrlene Broker, MD as  PCP - General (Internal Medicine) Mardella LaymanPatterson, David R, MD as Attending Physician (Gastroenterology) Moya, Harrietta GuardianFrank Joseph, MD (Ophthalmology)  Indicate any recent Medical Services you may have received from other than [facility] providers in the past year (date may be approximate).     Assessment:   This is a routine wellness examination for Melissa Warren.  Hearing/Vision screen No exam data present  Dietary issues and exercise activities discussed: Current Exercise Habits: The patient does not participate in regular exercise at present, Exercise limited by: Other - see comments (vision issues)  Goals    . Patient Stated     Get better control over my diet by monitoring and doing healthy choices. Stay physically and socially active. Drink fluids often and try to get better sleep by adopting some of the recommended sleep tips.      Depression Screen PHQ 2/9 Scores 01/02/2020 12/31/2018 12/22/2017 12/22/2017 12/18/2016 09/18/2015 09/06/2014  PHQ - 2 Score 0 0 0 0 1 0 0  PHQ- 9 Score - - - - 3 - -    Fall Risk Fall  Risk  01/02/2020 12/31/2018 12/31/2018 12/31/2018 12/22/2017  Falls in the past year? 0 0 0 0 1  Comment - Emmi Telephone Survey: data to providers prior to load - - -  Number falls in past yr: 0 - 0 - 0  Injury with Fall? 0 - 0 - 0  Risk for fall due to : No Fall Risks - - - -  Follow up Falls evaluation completed - - - -    Any stairs in or around the home? Yes  If so, are there any without handrails? No  Home free of loose throw rugs in walkways, pet beds, electrical cords, etc? Yes  Adequate lighting in your home to reduce risk of falls? Yes   ASSISTIVE DEVICES UTILIZED TO PREVENT FALLS:  Life alert? No  Use of a cane, walker or w/c? No  Grab bars in the bathroom? No  Shower chair or bench in shower? No  Elevated toilet seat or a handicapped toilet? No   TIMED UP AND GO:  Was the test performed? No .  Length of time to ambulate 10 feet: 0 sec.   Gait steady and fast without use of assistive device  Cognitive Function: MMSE - Mini Mental State Exam 12/18/2016  Orientation to time 5  Orientation to Place 5  Registration 3  Attention/ Calculation 5  Recall 2  Language- name 2 objects 2  Language- repeat 1  Language- follow 3 step command 3  Language- read & follow direction 1  Write a sentence 1  Copy design 1  Total score 29     6CIT Screen 12/31/2018  What Year? 0 points  What month? 0 points  What time? 0 points  Count back from 20 0 points  Months in reverse 0 points  Repeat phrase 2 points  Total Score 2    Immunizations Immunization History  Administered Date(s) Administered  . PFIZER SARS-COV-2 Vaccination 03/26/2019, 04/20/2019, 12/30/2019  . Pneumococcal Conjugate-13 12/29/2013  . Pneumococcal Polysaccharide-23 03/22/2012  . Tetanus 03/07/2011    TDAP status: Due, Education has been provided regarding the importance of this vaccine. Advised may receive this vaccine at local pharmacy or Health Dept. Aware to provide a copy of the vaccination  record if obtained from local pharmacy or Health Dept. Verbalized acceptance and understanding. Flu Vaccine status: Declined, Education has been provided regarding the importance of this vaccine but patient still declined. Advised may  receive this vaccine at local pharmacy or Health Dept. Aware to provide a copy of the vaccination record if obtained from local pharmacy or Health Dept. Verbalized acceptance and understanding. Pneumococcal vaccine status: Up to date Covid-19 vaccine status: Completed vaccines  Qualifies for Shingles Vaccine? Yes   Zostavax completed No   Shingrix Completed?: No.    Education has been provided regarding the importance of this vaccine. Patient has been advised to call insurance company to determine out of pocket expense if they have not yet received this vaccine. Advised may also receive vaccine at local pharmacy or Health Dept. Verbalized acceptance and understanding.  Screening Tests Health Maintenance  Topic Date Due  . TETANUS/TDAP  03/06/2021  . DEXA SCAN  Completed  . COVID-19 Vaccine  Completed  . PNA vac Low Risk Adult  Completed    Health Maintenance  There are no preventive care reminders to display for this patient.  Colorectal cancer screening: No longer required.  Mammogram status: Completed 11/28/2019. Repeat every year Bone Density status: Completed 09/06/2014. Results reflect: Bone density results: NORMAL. Repeat every 5 years.  Lung Cancer Screening: (Low Dose CT Chest recommended if Age 5-80 years, 30 pack-year currently smoking OR have quit w/in 15years.) does not qualify.   Lung Cancer Screening Referral: no  Additional Screening:  Hepatitis C Screening: does not qualify; Completed no  Vision Screening: Recommended annual ophthalmology exams for early detection of glaucoma and other disorders of the eye. Is the patient up to date with their annual eye exam?  Yes  Who is the provider or what is the name of the office in which the  patient attends annual eye exams? Arville Go, MD. If pt is not established with a provider, would they like to be referred to a provider to establish care? No .   Dental Screening: Recommended annual dental exams for proper oral hygiene  Community Resource Referral / Chronic Care Management: CRR required this visit?  No   CCM required this visit?  No      Plan:     I have personally reviewed and noted the following in the patient's chart:   . Medical and social history . Use of alcohol, tobacco or illicit drugs  . Current medications and supplements . Functional ability and status . Nutritional status . Physical activity . Advanced directives . List of other physicians . Hospitalizations, surgeries, and ER visits in previous 12 months . Vitals . Screenings to include cognitive, depression, and falls . Referrals and appointments  In addition, I have reviewed and discussed with patient certain preventive protocols, quality metrics, and best practice recommendations. A written personalized care plan for preventive services as well as general preventive health recommendations were provided to patient.     Mickeal Needy, LPN   [id number]   Nurse Notes:  Patient is cogitatively intact. There were no vitals filed for this visit. There is no height or weight on file to calculate BMI. Patient stated that she has no issues with gait or balance; does not use any assistive devices.

## 2020-01-10 ENCOUNTER — Other Ambulatory Visit: Payer: Self-pay | Admitting: Internal Medicine

## 2020-01-16 DIAGNOSIS — H401132 Primary open-angle glaucoma, bilateral, moderate stage: Secondary | ICD-10-CM | POA: Diagnosis not present

## 2020-01-16 DIAGNOSIS — Z961 Presence of intraocular lens: Secondary | ICD-10-CM | POA: Diagnosis not present

## 2020-01-27 ENCOUNTER — Other Ambulatory Visit: Payer: Self-pay

## 2020-01-27 ENCOUNTER — Ambulatory Visit (INDEPENDENT_AMBULATORY_CARE_PROVIDER_SITE_OTHER): Payer: Medicare Other | Admitting: Internal Medicine

## 2020-01-27 ENCOUNTER — Encounter: Payer: Self-pay | Admitting: Internal Medicine

## 2020-01-27 VITALS — BP 132/78 | HR 68 | Temp 98.0°F | Ht 62.0 in | Wt 163.4 lb

## 2020-01-27 DIAGNOSIS — I10 Essential (primary) hypertension: Secondary | ICD-10-CM | POA: Diagnosis not present

## 2020-01-27 DIAGNOSIS — K219 Gastro-esophageal reflux disease without esophagitis: Secondary | ICD-10-CM | POA: Diagnosis not present

## 2020-01-27 DIAGNOSIS — R739 Hyperglycemia, unspecified: Secondary | ICD-10-CM | POA: Diagnosis not present

## 2020-01-27 LAB — MICROALBUMIN / CREATININE URINE RATIO
Creatinine,U: 29.6 mg/dL
Microalb Creat Ratio: 2.4 mg/g (ref 0.0–30.0)
Microalb, Ur: <0.7 mg/dL (ref 0.0–1.9)

## 2020-01-27 LAB — CBC
HCT: 38.1 % (ref 36.0–46.0)
Hemoglobin: 13 g/dL (ref 12.0–15.0)
MCHC: 34.2 g/dL (ref 30.0–36.0)
MCV: 90.6 fl (ref 78.0–100.0)
Platelets: 272 10*3/uL (ref 150.0–400.0)
RBC: 4.2 Mil/uL (ref 3.87–5.11)
RDW: 12.5 % (ref 11.5–15.5)
WBC: 5.9 10*3/uL (ref 4.0–10.5)

## 2020-01-27 LAB — COMPREHENSIVE METABOLIC PANEL
ALT: 13 U/L (ref 0–35)
AST: 17 U/L (ref 0–37)
Albumin: 4.2 g/dL (ref 3.5–5.2)
Alkaline Phosphatase: 62 U/L (ref 39–117)
BUN: 21 mg/dL (ref 6–23)
CO2: 27 mEq/L (ref 19–32)
Calcium: 9.9 mg/dL (ref 8.4–10.5)
Chloride: 104 mEq/L (ref 96–112)
Creatinine, Ser: 1.32 mg/dL — ABNORMAL HIGH (ref 0.40–1.20)
GFR: 36.99 mL/min — ABNORMAL LOW (ref >60.00)
Glucose, Bld: 103 mg/dL — ABNORMAL HIGH (ref 70–99)
Potassium: 3.7 mEq/L (ref 3.5–5.1)
Sodium: 140 mEq/L (ref 135–145)
Total Bilirubin: 0.4 mg/dL (ref 0.2–1.2)
Total Protein: 7.3 g/dL (ref 6.0–8.3)

## 2020-01-27 LAB — LIPID PANEL
Cholesterol: 161 mg/dL (ref 0–200)
HDL: 48 mg/dL (ref >39.00)
LDL Cholesterol: 89 mg/dL (ref 0–99)
NonHDL: 112.82
Total CHOL/HDL Ratio: 3
Triglycerides: 117 mg/dL (ref 0.0–149.0)
VLDL: 23.4 mg/dL (ref 0.0–40.0)

## 2020-01-27 LAB — HEMOGLOBIN A1C: Hgb A1c MFr Bld: 6.2 % (ref 4.6–6.5)

## 2020-01-27 NOTE — Assessment & Plan Note (Addendum)
BP at goal on losartan/hctz. Checking CMP and adjust as needed. EKG done in the office to update baseline without significant changes.

## 2020-01-27 NOTE — Assessment & Plan Note (Signed)
Controlled off meds currently. Counseled about diet modifications to help with control.

## 2020-01-27 NOTE — Patient Instructions (Addendum)
Think about getting the shingrix vaccine at the pharmacy.   Diabetes Mellitus and Standards of Medical Care Managing diabetes (diabetes mellitus) can be complicated. Your diabetes treatment may be managed by a team of health care providers, including:  A physician who specializes in diabetes (endocrinologist).  A nurse practitioner or physician assistant.  Nurses.  A diet and nutrition specialist (registered dietitian).  A certified diabetes educator (CDE).  An exercise specialist.  A pharmacist.  An eye doctor.  A foot specialist (podiatrist).  A dentist.  A primary care provider.  A mental health provider. Your health care providers follow guidelines to help you get the best quality of care. The following schedule is a general guideline for your diabetes management plan. Your health care providers may give you more specific instructions. Physical exams Upon being diagnosed with diabetes mellitus, and each year after that, your health care provider will ask about your medical and family history. He or she will also do a physical exam. Your exam may include:  Measuring your height, weight, and body mass index (BMI).  Checking your blood pressure. This will be done at every routine medical visit. Your target blood pressure may vary depending on your medical conditions, your age, and other factors.  Thyroid gland exam.  Skin exam.  Screening for damage to your nerves (peripheral neuropathy). This may include checking the pulse in your legs and feet and checking the level of sensation in your hands and feet.  A complete foot exam to inspect the structure and skin of your feet, including checking for cuts, bruises, redness, blisters, sores, or other problems.  Screening for blood vessel (vascular) problems, which may include checking the pulse in your legs and feet and checking your temperature. Blood tests Depending on your treatment plan and your personal needs, you may  have the following tests done:  HbA1c (hemoglobin A1c). This test provides information about blood sugar (glucose) control over the previous 2-3 months. It is used to adjust your treatment plan, if needed. This test will be done: ? At least 2 times a year, if you are meeting your treatment goals. ? 4 times a year, if you are not meeting your treatment goals or if treatment goals have changed.  Lipid testing, including total, LDL, and HDL cholesterol and triglyceride levels. ? The goal for LDL is less than 100 mg/dL (5.5 mmol/L). If you are at high risk for complications, the goal is less than 70 mg/dL (3.9 mmol/L). ? The goal for HDL is 40 mg/dL (2.2 mmol/L) or higher for men and 50 mg/dL (2.8 mmol/L) or higher for women. An HDL cholesterol of 60 mg/dL (3.3 mmol/L) or higher gives some protection against heart disease. ? The goal for triglycerides is less than 150 mg/dL (8.3 mmol/L).  Liver function tests.  Kidney function tests.  Thyroid function tests. Dental and eye exams  Visit your dentist two times a year.  If you have type 1 diabetes, your health care provider may recommend an eye exam 3-5 years after you are diagnosed, and then once a year after your first exam. ? For children with type 1 diabetes, a health care provider may recommend an eye exam when your child is age 33 or older and has had diabetes for 3-5 years. After the first exam, your child should get an eye exam once a year.  If you have type 2 diabetes, your health care provider may recommend an eye exam as soon as you are diagnosed, and then  once a year after your first exam. Immunizations   The yearly flu (influenza) vaccine is recommended for everyone 6 months or older who has diabetes.  The pneumonia (pneumococcal) vaccine is recommended for everyone 2 years or older who has diabetes. If you are 56 or older, you may get the pneumonia vaccine as a series of two separate shots.  The hepatitis B vaccine is  recommended for adults shortly after being diagnosed with diabetes.  Adults and children with diabetes should receive all other vaccines according to age-specific recommendations from the Centers for Disease Control and Prevention (CDC). Mental and emotional health Screening for symptoms of eating disorders, anxiety, and depression is recommended at the time of diagnosis and afterward as needed. If your screening shows that you have symptoms (positive screening result), you may need more evaluation and you may work with a mental health care provider. Treatment plan Your treatment plan will be reviewed at every medical visit. You and your health care provider will discuss:  How you are taking your medicines, including insulin.  Any side effects you are experiencing.  Your blood glucose target goals.  The frequency of your blood glucose monitoring.  Lifestyle habits, such as activity level as well as tobacco, alcohol, and substance use. Diabetes self-management education Your health care provider will assess how well you are monitoring your blood glucose levels and whether you are taking your insulin correctly. He or she may refer you to:  A certified diabetes educator to manage your diabetes throughout your life, starting at diagnosis.  A registered dietitian who can create or review your personal nutrition plan.  An exercise specialist who can discuss your activity level and exercise plan. Summary  Managing diabetes (diabetes mellitus) can be complicated. Your diabetes treatment may be managed by a team of health care providers.  Your health care providers follow guidelines in order to help you get the best quality of care.  Standards of care including having regular physical exams, blood tests, blood pressure monitoring, immunizations, screening tests, and education about how to manage your diabetes.  Your health care providers may also give you more specific instructions based on  your individual health. This information is not intended to replace advice given to you by your health care provider. Make sure you discuss any questions you have with your health care provider. Document Revised: 10/16/2017 Document Reviewed: 10/26/2015 Elsevier Patient Education  2020 [employer].

## 2020-01-27 NOTE — Progress Notes (Signed)
   Subjective:   Patient ID: Melissa Warren, female    DOB: 08/10/1933, 86 y.o.   MRN: [id number]  HPI The patient is an 86 YO female coming in for follow up sugars (previously in the diabetes range but before that always pre-diabetes, denies numbness or tingling in hands or feet, does not check blood sugar, on ARB) and blood pressure (taking losartan/hctz, BP at goal, denies headaches or chest pains) and GERD (taking otc medications if needed, denies blood in stool or abdominal pain or gerd symptoms currently).   Review of Systems  Constitutional: Negative.   HENT: Negative.   Eyes: Negative.   Respiratory: Negative for cough, chest tightness and shortness of breath.   Cardiovascular: Negative for chest pain, palpitations and leg swelling.  Gastrointestinal: Negative for abdominal distention, abdominal pain, constipation, diarrhea, nausea and vomiting.  Musculoskeletal: Negative.   Skin: Negative.   Neurological: Negative.   Psychiatric/Behavioral: Negative.     Objective:  Physical Exam Constitutional:      Appearance: She is well-developed and well-nourished.  HENT:     Head: Normocephalic and atraumatic.  Eyes:     Extraocular Movements: EOM normal.  Cardiovascular:     Rate and Rhythm: Normal rate and regular rhythm.  Pulmonary:     Effort: Pulmonary effort is normal. No respiratory distress.     Breath sounds: Normal breath sounds. No wheezing or rales.  Abdominal:     General: Bowel sounds are normal. There is no distension.     Palpations: Abdomen is soft.     Tenderness: There is no abdominal tenderness. There is no rebound.  Musculoskeletal:        General: No edema.     Cervical back: Normal range of motion.  Skin:    General: Skin is warm and dry.     Comments: Foot exam done  Neurological:     Mental Status: She is alert and oriented to person, place, and time.     Coordination: Coordination normal.  Psychiatric:        Mood and Affect: Mood and affect normal.      Vitals:   01/27/20 1300  BP: 132/78  Pulse: 68  Temp: 98 F (36.7 C)  Weight: 163 lb 6.4 oz (74.1 kg)  Height: 5\' 2"  (1.575 m)   EKG: Rate 64, axis normal, interval normal, sinus, no st or t wave changes, overall low voltage which is unchanged from prior, no significant change compared to 2015  This visit occurred during the SARS-CoV-2 public health emergency.  Safety protocols were in place, including screening questions prior to the visit, additional usage of staff PPE, and extensive cleaning of exam room while observing appropriate contact time as indicated for disinfecting solutions.   Assessment & Plan:

## 2020-01-27 NOTE — Assessment & Plan Note (Signed)
Usually pre-diabetes and rechecking HgA1c. If still 6.5 or greater will diagnose with diabetes. Strong family history. Adjust as needed.

## 2020-01-30 ENCOUNTER — Telehealth: Payer: Self-pay | Admitting: Internal Medicine

## 2020-01-30 NOTE — Telephone Encounter (Signed)
Pt informed of below.     Please call and let her know that the sugars are in the pre-diabetes range again so we would like to see her back in 6 months to recheck. Other labs normal.

## 2020-01-30 NOTE — Telephone Encounter (Signed)
Please return  call to discuss lab results 

## 2020-03-26 DIAGNOSIS — Z961 Presence of intraocular lens: Secondary | ICD-10-CM | POA: Diagnosis not present

## 2020-03-26 DIAGNOSIS — H524 Presbyopia: Secondary | ICD-10-CM | POA: Diagnosis not present

## 2020-03-26 DIAGNOSIS — H401133 Primary open-angle glaucoma, bilateral, severe stage: Secondary | ICD-10-CM | POA: Diagnosis not present

## 2020-05-23 DIAGNOSIS — H401132 Primary open-angle glaucoma, bilateral, moderate stage: Secondary | ICD-10-CM | POA: Diagnosis not present

## 2020-07-19 DIAGNOSIS — H401132 Primary open-angle glaucoma, bilateral, moderate stage: Secondary | ICD-10-CM | POA: Diagnosis not present

## 2020-07-30 ENCOUNTER — Encounter: Payer: Self-pay | Admitting: Internal Medicine

## 2020-07-30 ENCOUNTER — Other Ambulatory Visit: Payer: Self-pay

## 2020-07-30 ENCOUNTER — Ambulatory Visit (INDEPENDENT_AMBULATORY_CARE_PROVIDER_SITE_OTHER): Payer: Medicare Other | Admitting: Internal Medicine

## 2020-07-30 VITALS — BP 130/72 | HR 68 | Temp 98.0°F | Ht 62.0 in | Wt 160.0 lb

## 2020-07-30 DIAGNOSIS — R7303 Prediabetes: Secondary | ICD-10-CM

## 2020-07-30 LAB — POCT GLYCOSYLATED HEMOGLOBIN (HGB A1C)
HbA1c POC (<> result, manual entry): 6 % (ref 4.0–5.6)
HbA1c, POC (controlled diabetic range): 6 % (ref 0.0–7.0)
HbA1c, POC (prediabetic range): 6 % (ref 5.7–6.4)
Hemoglobin A1C: 6 % — AB (ref 4.0–5.6)

## 2020-07-30 NOTE — Assessment & Plan Note (Signed)
POC HgA1c done in office at 6.0. She has modified diet and exercise which is helping. Strong family history of diabetes and needs ongoing monitoring every 6 months.

## 2020-07-30 NOTE — Progress Notes (Signed)
   Subjective:   Patient ID: Melissa Warren, female    DOB: 1933/04/27, 87 y.o.   MRN: [id number]  HPI The patient is an 87 YO female coming in for follow up pre-diabetes. Not exercising but has changed diet to help. Denies numbness or tingling.   Review of Systems  Constitutional: Negative.   HENT: Negative.    Eyes: Negative.   Respiratory:  Negative for cough, chest tightness and shortness of breath.   Cardiovascular:  Negative for chest pain, palpitations and leg swelling.  Gastrointestinal:  Negative for abdominal distention, abdominal pain, constipation, diarrhea, nausea and vomiting.  Musculoskeletal: Negative.   Skin: Negative.   Neurological: Negative.   Psychiatric/Behavioral: Negative.     Objective:  Physical Exam Constitutional:      Appearance: She is well-developed.  HENT:     Head: Normocephalic and atraumatic.  Cardiovascular:     Rate and Rhythm: Normal rate and regular rhythm.  Pulmonary:     Effort: Pulmonary effort is normal. No respiratory distress.     Breath sounds: Normal breath sounds. No wheezing or rales.  Abdominal:     General: Bowel sounds are normal. There is no distension.     Palpations: Abdomen is soft.     Tenderness: There is no abdominal tenderness. There is no rebound.  Musculoskeletal:     Cervical back: Normal range of motion.  Skin:    General: Skin is warm and dry.  Neurological:     Mental Status: She is alert and oriented to person, place, and time.     Coordination: Coordination normal.    There were no vitals filed for this visit.  This visit occurred during the SARS-CoV-2 public health emergency.  Safety protocols were in place, including screening questions prior to the visit, additional usage of staff PPE, and extensive cleaning of exam room while observing appropriate contact time as indicated for disinfecting solutions.   Assessment & Plan:

## 2020-07-30 NOTE — Patient Instructions (Signed)
Your HgA1c was 6.0 today so the sugars are doing well.

## 2020-09-03 DIAGNOSIS — Z961 Presence of intraocular lens: Secondary | ICD-10-CM | POA: Diagnosis not present

## 2020-09-03 DIAGNOSIS — H02889 Meibomian gland dysfunction of unspecified eye, unspecified eyelid: Secondary | ICD-10-CM | POA: Diagnosis not present

## 2020-09-03 DIAGNOSIS — H401132 Primary open-angle glaucoma, bilateral, moderate stage: Secondary | ICD-10-CM | POA: Diagnosis not present

## 2020-11-30 DIAGNOSIS — Z0000 Encounter for general adult medical examination without abnormal findings: Secondary | ICD-10-CM | POA: Diagnosis not present

## 2020-11-30 DIAGNOSIS — R2681 Unsteadiness on feet: Secondary | ICD-10-CM | POA: Diagnosis not present

## 2020-11-30 DIAGNOSIS — Z008 Encounter for other general examination: Secondary | ICD-10-CM | POA: Diagnosis not present

## 2020-11-30 DIAGNOSIS — Z6829 Body mass index (BMI) 29.0-29.9, adult: Secondary | ICD-10-CM | POA: Diagnosis not present

## 2020-11-30 DIAGNOSIS — E663 Overweight: Secondary | ICD-10-CM | POA: Diagnosis not present

## 2021-01-14 DIAGNOSIS — E559 Vitamin D deficiency, unspecified: Secondary | ICD-10-CM | POA: Diagnosis not present

## 2021-01-14 DIAGNOSIS — Z7189 Other specified counseling: Secondary | ICD-10-CM | POA: Diagnosis not present

## 2021-01-14 DIAGNOSIS — Z1159 Encounter for screening for other viral diseases: Secondary | ICD-10-CM | POA: Diagnosis not present

## 2021-01-14 DIAGNOSIS — B353 Tinea pedis: Secondary | ICD-10-CM | POA: Diagnosis not present

## 2021-01-14 DIAGNOSIS — I129 Hypertensive chronic kidney disease with stage 1 through stage 4 chronic kidney disease, or unspecified chronic kidney disease: Secondary | ICD-10-CM | POA: Diagnosis not present

## 2021-01-14 DIAGNOSIS — R2681 Unsteadiness on feet: Secondary | ICD-10-CM | POA: Diagnosis not present

## 2021-01-14 DIAGNOSIS — Z0001 Encounter for general adult medical examination with abnormal findings: Secondary | ICD-10-CM | POA: Diagnosis not present

## 2021-01-14 DIAGNOSIS — E663 Overweight: Secondary | ICD-10-CM | POA: Diagnosis not present

## 2021-01-14 DIAGNOSIS — Z79899 Other long term (current) drug therapy: Secondary | ICD-10-CM | POA: Diagnosis not present

## 2021-01-14 DIAGNOSIS — R7303 Prediabetes: Secondary | ICD-10-CM | POA: Diagnosis not present

## 2021-01-14 DIAGNOSIS — Z23 Encounter for immunization: Secondary | ICD-10-CM | POA: Diagnosis not present

## 2021-01-14 DIAGNOSIS — N1831 Chronic kidney disease, stage 3a: Secondary | ICD-10-CM | POA: Diagnosis not present

## 2024-02-17 ENCOUNTER — Ambulatory Visit
Admission: RE | Admit: 2024-02-17 | Discharge: 2024-02-17 | Disposition: A | Discharge status: Home / Self Care | Source: Ambulatory Visit | Attending: Family Medicine | Admitting: Family Medicine

## 2024-02-17 ENCOUNTER — Other Ambulatory Visit: Payer: Self-pay | Admitting: Family Medicine

## 2024-02-17 DIAGNOSIS — M79641 Pain in right hand: Secondary | ICD-10-CM
